# Patient Record
Sex: Female | Born: 1935 | Race: White | Hispanic: No | Marital: Married | Smoking: Never smoker
Health system: Southern US, Community
[De-identification: ages and names within clinical notes are randomized; demographics above are authoritative.]

## PROBLEM LIST (undated history)

## (undated) DIAGNOSIS — N189 Chronic kidney disease, unspecified: Secondary | ICD-10-CM

## (undated) DIAGNOSIS — K5790 Diverticulosis of intestine, part unspecified, without perforation or abscess without bleeding: Secondary | ICD-10-CM

## (undated) DIAGNOSIS — K219 Gastro-esophageal reflux disease without esophagitis: Secondary | ICD-10-CM

## (undated) DIAGNOSIS — M1990 Unspecified osteoarthritis, unspecified site: Secondary | ICD-10-CM

## (undated) DIAGNOSIS — I10 Essential (primary) hypertension: Secondary | ICD-10-CM

## (undated) DIAGNOSIS — J3489 Other specified disorders of nose and nasal sinuses: Secondary | ICD-10-CM

## (undated) DIAGNOSIS — R030 Elevated blood-pressure reading, without diagnosis of hypertension: Secondary | ICD-10-CM

## (undated) HISTORY — PX: OTHER SURGICAL HISTORY: SHX169

## (undated) HISTORY — PX: BILATERAL KNEE ARTHROSCOPY: SUR91

## (undated) HISTORY — DX: Diverticulosis of intestine, part unspecified, without perforation or abscess without bleeding: K57.90

## (undated) HISTORY — PX: ROTATOR CUFF REPAIR: SHX139

## (undated) HISTORY — PX: GALLBLADDER SURGERY: SHX652

## (undated) HISTORY — PX: COLONOSCOPY: SHX174

---

## 1998-09-22 ENCOUNTER — Other Ambulatory Visit: Admission: RE | Admit: 1998-09-22 | Discharge: 1998-09-22 | Payer: Self-pay | Admitting: Urology

## 2000-08-02 ENCOUNTER — Other Ambulatory Visit: Admission: RE | Admit: 2000-08-02 | Discharge: 2000-08-02 | Payer: Self-pay | Admitting: *Deleted

## 2000-09-29 ENCOUNTER — Encounter (INDEPENDENT_AMBULATORY_CARE_PROVIDER_SITE_OTHER): Payer: Self-pay | Admitting: *Deleted

## 2000-09-29 ENCOUNTER — Ambulatory Visit (HOSPITAL_COMMUNITY): Admission: RE | Admit: 2000-09-29 | Discharge: 2000-09-29 | Payer: Self-pay | Admitting: *Deleted

## 2001-02-12 ENCOUNTER — Emergency Department (HOSPITAL_COMMUNITY): Admission: EM | Admit: 2001-02-12 | Discharge: 2001-02-12 | Payer: Self-pay | Admitting: Emergency Medicine

## 2001-02-12 ENCOUNTER — Encounter: Payer: Self-pay | Admitting: Emergency Medicine

## 2002-08-06 ENCOUNTER — Other Ambulatory Visit: Admission: RE | Admit: 2002-08-06 | Discharge: 2002-08-06 | Payer: Self-pay | Admitting: Family Medicine

## 2002-09-09 ENCOUNTER — Encounter: Admission: RE | Admit: 2002-09-09 | Discharge: 2002-09-09 | Payer: Self-pay | Admitting: Family Medicine

## 2002-09-09 ENCOUNTER — Encounter: Payer: Self-pay | Admitting: Family Medicine

## 2004-04-27 ENCOUNTER — Encounter: Admission: RE | Admit: 2004-04-27 | Discharge: 2004-04-27 | Payer: Self-pay | Admitting: Family Medicine

## 2004-06-14 ENCOUNTER — Other Ambulatory Visit: Admission: RE | Admit: 2004-06-14 | Discharge: 2004-06-14 | Payer: Self-pay | Admitting: Family Medicine

## 2004-08-13 ENCOUNTER — Ambulatory Visit (HOSPITAL_COMMUNITY): Admission: RE | Admit: 2004-08-13 | Discharge: 2004-08-13 | Payer: Self-pay | Admitting: *Deleted

## 2004-08-13 ENCOUNTER — Encounter (INDEPENDENT_AMBULATORY_CARE_PROVIDER_SITE_OTHER): Payer: Self-pay | Admitting: *Deleted

## 2008-09-17 ENCOUNTER — Encounter: Admission: RE | Admit: 2008-09-17 | Discharge: 2008-09-17 | Payer: Self-pay | Admitting: Orthopaedic Surgery

## 2008-09-23 ENCOUNTER — Ambulatory Visit (HOSPITAL_BASED_OUTPATIENT_CLINIC_OR_DEPARTMENT_OTHER): Admission: RE | Admit: 2008-09-23 | Discharge: 2008-09-23 | Payer: Self-pay | Admitting: Orthopaedic Surgery

## 2009-08-14 ENCOUNTER — Encounter: Admission: RE | Admit: 2009-08-14 | Discharge: 2009-08-14 | Payer: Self-pay | Admitting: Family Medicine

## 2010-10-26 ENCOUNTER — Other Ambulatory Visit: Payer: Self-pay | Admitting: Family Medicine

## 2010-10-26 DIAGNOSIS — Z1231 Encounter for screening mammogram for malignant neoplasm of breast: Secondary | ICD-10-CM

## 2010-11-03 ENCOUNTER — Ambulatory Visit
Admission: RE | Admit: 2010-11-03 | Discharge: 2010-11-03 | Disposition: A | Discharge status: Home / Self Care | Payer: Medicare Other | Source: Ambulatory Visit | Attending: Family Medicine | Admitting: Family Medicine

## 2010-11-03 DIAGNOSIS — Z1231 Encounter for screening mammogram for malignant neoplasm of breast: Secondary | ICD-10-CM

## 2010-11-16 LAB — POCT HEMOGLOBIN-HEMACUE: Hemoglobin: 14.8 g/dL (ref 12.0–15.0)

## 2010-12-14 NOTE — Op Note (Signed)
[name], [name] NO.:  [id number]   MEDICAL RECORD NO.:  [id number]           PATIENT TYPE:   LOCATION:                                 FACILITY:   PHYSICIAN:  Lubertha Basque. Dalldorf, M.D.DATE OF BIRTH:  September 08, 1935   DATE OF PROCEDURE:  09/23/2008  DATE OF DISCHARGE:                               OPERATIVE REPORT   PREOPERATIVE DIAGNOSES:  1. Left shoulder impingement.  2. Left shoulder acromioclavicular spur.   POSTOPERATIVE DIAGNOSES:  1. Left shoulder impingement  2. Left shoulder acromioclavicular spur.  3. Left shoulder degenerative joint disease.   PROCEDURES:  1. Left shoulder arthroscopic acromioplasty.  2. Left shoulder arthroscopic partial claviculectomy.  3. Left shoulder arthroscopic debridement and chondroplasty.   ANESTHESIA:  General and block.   ATTENDING SURGEON:  Lubertha Basque. Jerl Santos, MD   ASSISTANT:  Lindwood Qua, PA   INDICATIONS FOR PROCEDURE:  The patient is a 75 year old woman with at  least a year of shoulder pain, which we have managed with oral anti-  inflammatories, exercise program, and injection.  She persists with  pain, which limits her ability to rest and use her arm.  She is offered  an arthroscopy.  Informed operative consent was obtained after  discussion of possible complications including reaction to anesthesia  and infection.   SUMMARY/FINDINGS/PROCEDURE:  Under general anesthesia and a block, a  left shoulder arthroscopy was performed.  Glenohumeral joint did show  some grade 3 and 4 change, especially across the humeral head.  A  thorough chondroplasty was done.  The glenoid appeared relatively spared  with just grade 2 and 3 change.  The biceps tendon appeared normal.  The  rotator cuff appeared benign from below and the subscapularis tendon  appeared normal.  In the subacromial space, she had bursitis and a  prominent subacromial morphology.  An acromioplasty was done along with  a coplane of the [facility]  joint.   DESCRIPTION OF PROCEDURE:  The patient was taken to the operating suite  where general anesthetic was applied without difficulty.  She was also  given a block in the preanesthesia area.  She was positioned in a beach  chair position and prepped and draped in normal sterile fashion.  After  demonstration of IV Kefzol, an arthroscopy of left shoulder was  performed through a total of 3 portals.  Findings were as noted above  and procedure consisted of the debridement of the glenohumeral joint  initially.  We did this with the shaver.  She did have some grade 4  change across some dime-sized areas of her humeral head.  Some loose  flaps of articular cartilage were removed.  The biceps tendon appeared  intact though it was partially detached and consistent with a flap on  the superior aspect of the glenoid.  This did not come down into the  joint.  The rotator cuff appeared benign from below.  In the subacromial  space, an acromioplasty was done with a bur in the lateral position  followed by transfer of the bur to the posterior position.  I also  removed a spur  from the distal clavicle.  The cuff was inspected and no  significant tear was found.  The shoulder was thoroughly irrigated  followed by reapproximation of portals loosely with nylon.  Adaptic was  applied followed by dry gauze and tape.  Estimated blood loss and  intraoperative fluids obtained from anesthesia records.   DISPOSITION:  The patient was extubated in the operating room and taken  to recovery room in stable addition.  She was to go home the same day  and follow up with me in my office next week.  I will contact her by  phone tonight.      Lubertha Basque Jerl Santos, M.D.  Electronically Signed     PGD/MEDQ  D:  09/23/2008  T:  09/23/2008  Job:  [id number]

## 2010-12-17 NOTE — Procedures (Signed)
[place]  Patient:    [name], [name]                      MRN: [id number] Proc. Date: 09/29/00 Attending:  Sabino Gasser, M.D.                           Procedure Report  PROCEDURE:  Colonoscopy.  INDICATIONS:  Colon polyps.  ANESTHESIA:  Demerol 70 mg, Versed 7 mg.  DESCRIPTION OF PROCEDURE:  With the patient mildly sedated in the left lateral decubitus position, the Olympus videoscopic colonoscope was inserted in the rectum and passed under direct vision to the cecum, identified by the ileocecal valve and appendiceal orifice.  The cecum was full of corn and solid material, but no gross lesions were seen.  We cleared it as best we could. From this point, the colonoscope was then slowly withdrawn, taking circumferential views of the entire colonic mucosa, stopping only again at about 20 cm from the anal verge, where a small polyp was seen, photographed, and removed using hot biopsy forceps technique, setting of 20-20 blended current.  Once removed, the endoscope was withdrawn through the rectum, which appeared normal on direct but showed internal hemorrhoids on retroflex view. The endoscope was straightened and withdrawn.  The patients vital signs and pulse oximetry remained stable.  The patient tolerated the procedure well without apparent complications.  FINDINGS:  Polyp at 20 cm, otherwise unremarkable colonoscopic examination.  PLAN:  The patient will follow up with me in my office as an outpatient. DD:  09/29/00 TD:  09/29/00 Job: [id number]

## 2011-11-21 ENCOUNTER — Other Ambulatory Visit: Payer: Self-pay | Admitting: Family Medicine

## 2011-11-21 DIAGNOSIS — Z1231 Encounter for screening mammogram for malignant neoplasm of breast: Secondary | ICD-10-CM

## 2011-11-30 ENCOUNTER — Ambulatory Visit
Admission: RE | Admit: 2011-11-30 | Discharge: 2011-11-30 | Disposition: A | Discharge status: Home / Self Care | Payer: Medicare Other | Source: Ambulatory Visit | Attending: Family Medicine | Admitting: Family Medicine

## 2011-11-30 DIAGNOSIS — Z1231 Encounter for screening mammogram for malignant neoplasm of breast: Secondary | ICD-10-CM

## 2015-04-02 ENCOUNTER — Other Ambulatory Visit: Payer: Self-pay | Admitting: Gastroenterology

## 2015-04-21 ENCOUNTER — Other Ambulatory Visit: Payer: Self-pay

## 2015-04-21 DIAGNOSIS — Z1231 Encounter for screening mammogram for malignant neoplasm of breast: Secondary | ICD-10-CM

## 2015-04-24 ENCOUNTER — Ambulatory Visit
Admission: RE | Admit: 2015-04-24 | Discharge: 2015-04-24 | Disposition: A | Discharge status: Home / Self Care | Payer: Commercial Managed Care - HMO | Source: Ambulatory Visit

## 2015-04-24 DIAGNOSIS — Z1231 Encounter for screening mammogram for malignant neoplasm of breast: Secondary | ICD-10-CM

## 2015-04-27 ENCOUNTER — Other Ambulatory Visit: Payer: Self-pay | Admitting: Family Medicine

## 2015-04-27 DIAGNOSIS — R928 Other abnormal and inconclusive findings on diagnostic imaging of breast: Secondary | ICD-10-CM

## 2015-05-01 ENCOUNTER — Other Ambulatory Visit: Payer: Commercial Managed Care - HMO

## 2015-05-08 ENCOUNTER — Other Ambulatory Visit: Payer: Self-pay | Admitting: Family Medicine

## 2015-05-08 ENCOUNTER — Ambulatory Visit
Admission: RE | Admit: 2015-05-08 | Discharge: 2015-05-08 | Disposition: A | Discharge status: Home / Self Care | Payer: Commercial Managed Care - HMO | Source: Ambulatory Visit | Attending: Family Medicine | Admitting: Family Medicine

## 2015-05-08 DIAGNOSIS — R928 Other abnormal and inconclusive findings on diagnostic imaging of breast: Secondary | ICD-10-CM

## 2016-09-22 DIAGNOSIS — M25571 Pain in right ankle and joints of right foot: Secondary | ICD-10-CM | POA: Diagnosis not present

## 2016-09-22 DIAGNOSIS — M76821 Posterior tibial tendinitis, right leg: Secondary | ICD-10-CM | POA: Diagnosis not present

## 2016-10-03 DIAGNOSIS — M76821 Posterior tibial tendinitis, right leg: Secondary | ICD-10-CM | POA: Diagnosis not present

## 2016-10-19 DIAGNOSIS — M76821 Posterior tibial tendinitis, right leg: Secondary | ICD-10-CM | POA: Diagnosis not present

## 2016-11-03 DIAGNOSIS — M76821 Posterior tibial tendinitis, right leg: Secondary | ICD-10-CM | POA: Diagnosis not present

## 2016-11-03 DIAGNOSIS — M79672 Pain in left foot: Secondary | ICD-10-CM | POA: Diagnosis not present

## 2016-11-24 DIAGNOSIS — Z0100 Encounter for examination of eyes and vision without abnormal findings: Secondary | ICD-10-CM | POA: Diagnosis not present

## 2016-11-24 DIAGNOSIS — H524 Presbyopia: Secondary | ICD-10-CM | POA: Diagnosis not present

## 2017-01-11 DIAGNOSIS — M25571 Pain in right ankle and joints of right foot: Secondary | ICD-10-CM | POA: Diagnosis not present

## 2017-01-11 DIAGNOSIS — E559 Vitamin D deficiency, unspecified: Secondary | ICD-10-CM | POA: Diagnosis not present

## 2017-01-11 DIAGNOSIS — E669 Obesity, unspecified: Secondary | ICD-10-CM | POA: Diagnosis not present

## 2017-01-11 DIAGNOSIS — Z6838 Body mass index (BMI) 38.0-38.9, adult: Secondary | ICD-10-CM | POA: Diagnosis not present

## 2017-01-11 DIAGNOSIS — Z9181 History of falling: Secondary | ICD-10-CM | POA: Diagnosis not present

## 2017-01-11 DIAGNOSIS — Z0000 Encounter for general adult medical examination without abnormal findings: Secondary | ICD-10-CM | POA: Diagnosis not present

## 2017-09-15 DIAGNOSIS — Z6839 Body mass index (BMI) 39.0-39.9, adult: Secondary | ICD-10-CM | POA: Diagnosis not present

## 2017-09-15 DIAGNOSIS — G8929 Other chronic pain: Secondary | ICD-10-CM | POA: Diagnosis not present

## 2017-09-15 DIAGNOSIS — H547 Unspecified visual loss: Secondary | ICD-10-CM | POA: Diagnosis not present

## 2017-09-15 DIAGNOSIS — K5900 Constipation, unspecified: Secondary | ICD-10-CM | POA: Diagnosis not present

## 2017-09-15 DIAGNOSIS — M1990 Unspecified osteoarthritis, unspecified site: Secondary | ICD-10-CM | POA: Diagnosis not present

## 2017-09-15 DIAGNOSIS — E559 Vitamin D deficiency, unspecified: Secondary | ICD-10-CM | POA: Diagnosis not present

## 2017-09-15 DIAGNOSIS — Z8249 Family history of ischemic heart disease and other diseases of the circulatory system: Secondary | ICD-10-CM | POA: Diagnosis not present

## 2017-09-15 DIAGNOSIS — R030 Elevated blood-pressure reading, without diagnosis of hypertension: Secondary | ICD-10-CM | POA: Diagnosis not present

## 2017-09-15 DIAGNOSIS — Z809 Family history of malignant neoplasm, unspecified: Secondary | ICD-10-CM | POA: Diagnosis not present

## 2017-10-20 DIAGNOSIS — Z131 Encounter for screening for diabetes mellitus: Secondary | ICD-10-CM | POA: Diagnosis not present

## 2017-10-20 DIAGNOSIS — Z0000 Encounter for general adult medical examination without abnormal findings: Secondary | ICD-10-CM | POA: Diagnosis not present

## 2017-10-20 DIAGNOSIS — M8580 Other specified disorders of bone density and structure, unspecified site: Secondary | ICD-10-CM | POA: Diagnosis not present

## 2017-10-20 DIAGNOSIS — E559 Vitamin D deficiency, unspecified: Secondary | ICD-10-CM | POA: Diagnosis not present

## 2017-10-20 DIAGNOSIS — Z1389 Encounter for screening for other disorder: Secondary | ICD-10-CM | POA: Diagnosis not present

## 2017-10-20 DIAGNOSIS — M150 Primary generalized (osteo)arthritis: Secondary | ICD-10-CM | POA: Diagnosis not present

## 2018-01-26 DIAGNOSIS — M25561 Pain in right knee: Secondary | ICD-10-CM | POA: Diagnosis not present

## 2018-01-26 DIAGNOSIS — M1711 Unilateral primary osteoarthritis, right knee: Secondary | ICD-10-CM | POA: Diagnosis not present

## 2018-02-06 DIAGNOSIS — R69 Illness, unspecified: Secondary | ICD-10-CM | POA: Diagnosis not present

## 2018-02-28 DIAGNOSIS — M8588 Other specified disorders of bone density and structure, other site: Secondary | ICD-10-CM | POA: Diagnosis not present

## 2018-04-06 DIAGNOSIS — H0100B Unspecified blepharitis left eye, upper and lower eyelids: Secondary | ICD-10-CM | POA: Diagnosis not present

## 2018-04-06 DIAGNOSIS — H04123 Dry eye syndrome of bilateral lacrimal glands: Secondary | ICD-10-CM | POA: Diagnosis not present

## 2018-04-06 DIAGNOSIS — H0100A Unspecified blepharitis right eye, upper and lower eyelids: Secondary | ICD-10-CM | POA: Diagnosis not present

## 2018-04-06 DIAGNOSIS — Z9849 Cataract extraction status, unspecified eye: Secondary | ICD-10-CM | POA: Diagnosis not present

## 2018-04-06 DIAGNOSIS — Z961 Presence of intraocular lens: Secondary | ICD-10-CM | POA: Diagnosis not present

## 2018-04-06 DIAGNOSIS — H11153 Pinguecula, bilateral: Secondary | ICD-10-CM | POA: Diagnosis not present

## 2018-04-06 DIAGNOSIS — H11022 Central pterygium of left eye: Secondary | ICD-10-CM | POA: Diagnosis not present

## 2018-04-06 DIAGNOSIS — H18413 Arcus senilis, bilateral: Secondary | ICD-10-CM | POA: Diagnosis not present

## 2018-04-06 DIAGNOSIS — H353111 Nonexudative age-related macular degeneration, right eye, early dry stage: Secondary | ICD-10-CM | POA: Diagnosis not present

## 2018-04-06 DIAGNOSIS — H3530 Unspecified macular degeneration: Secondary | ICD-10-CM | POA: Diagnosis not present

## 2018-05-14 DIAGNOSIS — Z23 Encounter for immunization: Secondary | ICD-10-CM | POA: Diagnosis not present

## 2018-05-14 DIAGNOSIS — Z01810 Encounter for preprocedural cardiovascular examination: Secondary | ICD-10-CM | POA: Diagnosis not present

## 2018-05-14 DIAGNOSIS — H52223 Regular astigmatism, bilateral: Secondary | ICD-10-CM | POA: Diagnosis not present

## 2018-05-14 DIAGNOSIS — M1710 Unilateral primary osteoarthritis, unspecified knee: Secondary | ICD-10-CM | POA: Diagnosis not present

## 2018-05-14 DIAGNOSIS — H5203 Hypermetropia, bilateral: Secondary | ICD-10-CM | POA: Diagnosis not present

## 2018-05-14 DIAGNOSIS — H524 Presbyopia: Secondary | ICD-10-CM | POA: Diagnosis not present

## 2018-05-14 DIAGNOSIS — H353131 Nonexudative age-related macular degeneration, bilateral, early dry stage: Secondary | ICD-10-CM | POA: Diagnosis not present

## 2018-05-26 DIAGNOSIS — H6501 Acute serous otitis media, right ear: Secondary | ICD-10-CM | POA: Diagnosis not present

## 2018-06-18 ENCOUNTER — Ambulatory Visit: Payer: Self-pay | Admitting: Orthopedic Surgery

## 2018-07-31 ENCOUNTER — Ambulatory Visit: Payer: Self-pay | Admitting: Orthopedic Surgery

## 2018-07-31 NOTE — H&P (Signed)
[name] is an 82 y.o. female.   Chief Complaint: R knee pain HPI: [name] is here today for her H&P. She is scheduled for a right total knee arthroplasty on August 15, 2018 at [facility] by Dr. Susa Day.  She reports progressively worsening chronic right knee pain refractory to conservative treatment including injection therapy, activity modification, relative rest, medication, quad strengthening. Pain is interfering with quality-of-life and activities of daily living at this point and she desires to proceed with surgery.  Dr. Tonita Cong and the patient mutually agreed to proceed with a total knee replacement. Risks and benefits of the procedure were discussed including stiffness, suboptimal range of motion, persistent pain, infection requiring removal of prosthesis and reinsertion, need for prophylactic antibiotics in the future, for example, dental procedures, possible need for manipulation, revision in the future and also anesthetic complications including DVT, PE, etc. We discussed the perioperative course, time in the hospital, postoperative recovery and the need for elevation to control swelling. We also discussed the predicted range of motion and the probability that squatting and kneeling would be unobtainable in the future. In addition, postoperative anticoagulation was discussed. We have obtained preoperative medical clearance as necessary. Provided illustrated handout and discussed it in detail. They will enroll in the total joint replacement educational forum at the hospital.  She has not yet scheduled for her [name] long preop appointment. She has been cleared by her PCP Dr. Laurann Montana. She has no history of MRSA or DVT. She tolerates aspirin without an issue. She has not had issues with postop nausea and vomiting in the past. She has not had allergies to pain medications.  Past Medical Hx: Osteoarthritis Measles Mumps  Past Surgical Hx: R knee arthroscopy  Rotator  cuff repair Cholecystectomy  No family history on file.   Social History:  Tobacco Smoking Status: Never smoker Non-smoker Most Recent Tobacco Use Screening: 01/26/2018 Chewing tobacco: none Alcohol intake: None Hand Dominance: Right Work related injury?: N Advance directive: N Medical Power of Attorney: N  Allergies: NKDA  Medications: ergocalciferol (vitamin D2) 1,250 mcg (50,000 unit) capsule Multi Vitamin  Review of Systems  Constitutional: Negative.   HENT: Negative.   Eyes: Negative.   Respiratory: Negative.   Cardiovascular: Negative.   Gastrointestinal: Negative.   Genitourinary: Negative.   Musculoskeletal: Positive for joint pain.  Skin: Negative.   Neurological: Negative.   Psychiatric/Behavioral: Negative.     There were no vitals taken for this visit. Physical Exam  Constitutional: She is oriented to person, place, and time. She appears well-developed and well-nourished.  HENT:  Head: Normocephalic.  Eyes: Pupils are equal, round, and reactive to light. Conjunctivae are normal.  Neck: Normal range of motion.  Cardiovascular: Normal rate.  Respiratory: Effort normal.  GI: Soft.  Musculoskeletal:     Comments: Patient is an 82 year old female.  Constitutional: General Appearance: healthy-appearing and NAD.  Gait and Station: Appearance: antalgic gait.  Cardiovascular System: Arterial Pulses Right: femoral normal, popliteal normal, dorsalis pedis normal, and posterior tibialis normal. Edema Right: no edema. Varicosities Right: no varicosities. Varicosities Left: no varicosities and capillary refill test normal.  Lymph Nodes: Inspection/Palpation Right: no inguinal LAD.  Knees: Inspection Right: no deformity. Inspection Left: swelling and genu varum deformity. Bony Palpation Right: no tenderness of the inferior pole patella, the superior pole patella, the tibial tubercle, the lateral joint line, the medial tibial plateau, Gerdy's tubercle, or the neck  of fibula and tenderness of the lateral patellar facet, the medial femoral  condyle, the medial joint line, and the lateral femoral condyle. Soft Tissue Palpation Right: no tenderness of the quadriceps tendon, the prepatellar bursa, the patellar tendon, the medial collateral ligament, the saphenous nerve, the lateral collateral ligament, the infrapatellar tendon, or the common peroneal nerve. Active Range of Motion Right: flexion (110 deg) and extension (0 deg.). Stability Right: no ligamentous instability, anterior drawer sign negative, posterior drawer sign negative, Lachman test negative, and laxity. Special Tests Right: McMurray's test negative. Strength Right: no hamstring weakness or quadriceps weakness and flexion 5/5 and extension 5/5.  Skin: Right Lower Extremity: normal.  Neurologic: Ankle Reflex Right: normal (2). Knee Reflex Right: normal (2). Sensation on the Right: L2 normal, L3 normal, L4 normal, L5 normal, and S1 normal.  Psychiatric: Mood and Affect: active and alert and normal mood.   Neurological: She is alert and oriented to person, place, and time.  Skin: Skin is warm and dry.    Standing x-rays with bone-on-bone medial joint space narrowing with a varus deformity.  Assessment/Plan 1. Osteoarthritis of right knee joint M17.11: Unilateral primary osteoarthritis, right knee  Plan: Pt with end-stage Right knee DJD, bone-on-bone, refractory to conservative tx, scheduled for Right total knee replacement by Dr. Tonita Cong on 08/15/2018. We again discussed the procedure itself as well as risks, complications and alternatives, including but not limited to DVT, PE, infx, bleeding, failure of procedure, need for secondary procedure including manipulation, nerve injury, ongoing pain/symptoms, anesthesia risk, even stroke or death. Also discussed typical post-op protocols, activity restrictions, need for PT, flexion/extension exercises, time out of work. Discussed need for DVT ppx post-op per  protocol. Discussed dental ppx and infx prevention. Also discussed limitations post-operatively such as kneeling and squatting. All questions were answered. Patient desires to proceed with surgery as scheduled.  Will hold supplements, ASA and NSAIDs accordingly. Will remain NPO after midnight the night before surgery. Will present to [facility] for pre-op testing. Anticipate hospital stay to include at least 2 midnights given medical history and to ensure proper pain control. Plan ASA 325mg  BID for DVT ppx post-op. Plan Oxycodone, Colace, Miralax. Plan home with HHPT post-op with family members at home for assistance then transition to outpt PT at 2 weeks post-op. Will follow up 10-14 days post-op for staple removal and xrays.  Anticipated LOS equal to or greater than 2 midnights due to - Age 67 and older with one or more of the following:  - Obesity  - Expected need for hospital services (PT, OT, Nursing) required for safe  discharge  - Anticipated need for postoperative skilled nursing care or inpatient rehab  - Active co-morbidities: None  Plan Right total knee replacement  Cecilie Kicks., PA-C for Dr. Tonita Cong 07/31/2018, 10:11 AM

## 2018-07-31 NOTE — H&P (View-Only) (Signed)
[name] is an 82 y.o. female.   Chief Complaint: R knee pain HPI: [name] is here today for her H&P. She is scheduled for a right total knee arthroplasty on August 15, 2018 at [facility] by Dr. Susa Day.  She reports progressively worsening chronic right knee pain refractory to conservative treatment including injection therapy, activity modification, relative rest, medication, quad strengthening. Pain is interfering with quality-of-life and activities of daily living at this point and she desires to proceed with surgery.  Dr. Tonita Cong and the patient mutually agreed to proceed with a total knee replacement. Risks and benefits of the procedure were discussed including stiffness, suboptimal range of motion, persistent pain, infection requiring removal of prosthesis and reinsertion, need for prophylactic antibiotics in the future, for example, dental procedures, possible need for manipulation, revision in the future and also anesthetic complications including DVT, PE, etc. We discussed the perioperative course, time in the hospital, postoperative recovery and the need for elevation to control swelling. We also discussed the predicted range of motion and the probability that squatting and kneeling would be unobtainable in the future. In addition, postoperative anticoagulation was discussed. We have obtained preoperative medical clearance as necessary. Provided illustrated handout and discussed it in detail. They will enroll in the total joint replacement educational forum at the hospital.  She has not yet scheduled for her [name] long preop appointment. She has been cleared by her PCP Dr. Laurann Montana. She has no history of MRSA or DVT. She tolerates aspirin without an issue. She has not had issues with postop nausea and vomiting in the past. She has not had allergies to pain medications.  Past Medical Hx: Osteoarthritis Measles Mumps  Past Surgical Hx: R knee arthroscopy  Rotator  cuff repair Cholecystectomy  No family history on file.   Social History:  Tobacco Smoking Status: Never smoker Non-smoker Most Recent Tobacco Use Screening: 01/26/2018 Chewing tobacco: none Alcohol intake: None Hand Dominance: Right Work related injury?: N Advance directive: N Medical Power of Attorney: N  Allergies: NKDA  Medications: ergocalciferol (vitamin D2) 1,250 mcg (50,000 unit) capsule Multi Vitamin  Review of Systems  Constitutional: Negative.   HENT: Negative.   Eyes: Negative.   Respiratory: Negative.   Cardiovascular: Negative.   Gastrointestinal: Negative.   Genitourinary: Negative.   Musculoskeletal: Positive for joint pain.  Skin: Negative.   Neurological: Negative.   Psychiatric/Behavioral: Negative.     There were no vitals taken for this visit. Physical Exam  Constitutional: She is oriented to person, place, and time. She appears well-developed and well-nourished.  HENT:  Head: Normocephalic.  Eyes: Pupils are equal, round, and reactive to light. Conjunctivae are normal.  Neck: Normal range of motion.  Cardiovascular: Normal rate.  Respiratory: Effort normal.  GI: Soft.  Musculoskeletal:     Comments: Patient is an 82 year old female.  Constitutional: General Appearance: healthy-appearing and NAD.  Gait and Station: Appearance: antalgic gait.  Cardiovascular System: Arterial Pulses Right: femoral normal, popliteal normal, dorsalis pedis normal, and posterior tibialis normal. Edema Right: no edema. Varicosities Right: no varicosities. Varicosities Left: no varicosities and capillary refill test normal.  Lymph Nodes: Inspection/Palpation Right: no inguinal LAD.  Knees: Inspection Right: no deformity. Inspection Left: swelling and genu varum deformity. Bony Palpation Right: no tenderness of the inferior pole patella, the superior pole patella, the tibial tubercle, the lateral joint line, the medial tibial plateau, Gerdy's tubercle, or the neck  of fibula and tenderness of the lateral patellar facet, the medial femoral  condyle, the medial joint line, and the lateral femoral condyle. Soft Tissue Palpation Right: no tenderness of the quadriceps tendon, the prepatellar bursa, the patellar tendon, the medial collateral ligament, the saphenous nerve, the lateral collateral ligament, the infrapatellar tendon, or the common peroneal nerve. Active Range of Motion Right: flexion (110 deg) and extension (0 deg.). Stability Right: no ligamentous instability, anterior drawer sign negative, posterior drawer sign negative, Lachman test negative, and laxity. Special Tests Right: McMurray's test negative. Strength Right: no hamstring weakness or quadriceps weakness and flexion 5/5 and extension 5/5.  Skin: Right Lower Extremity: normal.  Neurologic: Ankle Reflex Right: normal (2). Knee Reflex Right: normal (2). Sensation on the Right: L2 normal, L3 normal, L4 normal, L5 normal, and S1 normal.  Psychiatric: Mood and Affect: active and alert and normal mood.   Neurological: She is alert and oriented to person, place, and time.  Skin: Skin is warm and dry.    Standing x-rays with bone-on-bone medial joint space narrowing with a varus deformity.  Assessment/Plan 1. Osteoarthritis of right knee joint M17.11: Unilateral primary osteoarthritis, right knee  Plan: Pt with end-stage Right knee DJD, bone-on-bone, refractory to conservative tx, scheduled for Right total knee replacement by Dr. Tonita Cong on 08/15/2018. We again discussed the procedure itself as well as risks, complications and alternatives, including but not limited to DVT, PE, infx, bleeding, failure of procedure, need for secondary procedure including manipulation, nerve injury, ongoing pain/symptoms, anesthesia risk, even stroke or death. Also discussed typical post-op protocols, activity restrictions, need for PT, flexion/extension exercises, time out of work. Discussed need for DVT ppx post-op per  protocol. Discussed dental ppx and infx prevention. Also discussed limitations post-operatively such as kneeling and squatting. All questions were answered. Patient desires to proceed with surgery as scheduled.  Will hold supplements, ASA and NSAIDs accordingly. Will remain NPO after midnight the night before surgery. Will present to [facility] for pre-op testing. Anticipate hospital stay to include at least 2 midnights given medical history and to ensure proper pain control. Plan ASA 325mg  BID for DVT ppx post-op. Plan Oxycodone, Colace, Miralax. Plan home with HHPT post-op with family members at home for assistance then transition to outpt PT at 2 weeks post-op. Will follow up 10-14 days post-op for staple removal and xrays.  Anticipated LOS equal to or greater than 2 midnights due to - Age 65 and older with one or more of the following:  - Obesity  - Expected need for hospital services (PT, OT, Nursing) required for safe  discharge  - Anticipated need for postoperative skilled nursing care or inpatient rehab  - Active co-morbidities: None  Plan Right total knee replacement  Cecilie Kicks., PA-C for Dr. Tonita Cong 07/31/2018, 10:11 AM

## 2018-08-09 NOTE — Patient Instructions (Signed)
[name]  08/09/2018   Your procedure is scheduled on: 08-15-2018    Report to [facility] Main  Entrance     Report to admitting at 6:30AM    Call this number if you have problems the morning of surgery [phone]       Remember: Do not eat food or drink liquids :After Midnight. BRUSH YOUR TEETH MORNING OF SURGERY AND RINSE YOUR MOUTH OUT, NO CHEWING GUM CANDY OR MINTS.     Take these medicines the morning of surgery with A SIP OF WATER: EYE DROPS IF NEEDED                                 You may not have any metal on your body including hair pins and              piercings  Do not wear jewelry, make-up, lotions, powders or perfumes, deodorant             Do not wear nail polish.  Do not shave  48 hours prior to surgery.               Do not bring valuables to the hospital. [place].  Contacts, dentures or bridgework may not be worn into surgery.  Leave suitcase in the car. After surgery it may be brought to your room.                   Please read over the following fact sheets you were given: _____________________________________________________________________             [facility] - Preparing for Surgery Before surgery, you can play an important role.  Because skin is not sterile, your skin needs to be as free of germs as possible.  You can reduce the number of germs on your skin by washing with CHG (chlorahexidine gluconate) soap before surgery.  CHG is an antiseptic cleaner which kills germs and bonds with the skin to continue killing germs even after washing. Please DO NOT use if you have an allergy to CHG or antibacterial soaps.  If your skin becomes reddened/irritated stop using the CHG and inform your nurse when you arrive at Short Stay. Do not shave (including legs and underarms) for at least 48 hours prior to the first CHG shower.  You may shave your face/neck. Please follow  these instructions carefully:  1.  Shower with CHG Soap the night before surgery and the  morning of Surgery.  2.  If you choose to wash your hair, wash your hair first as usual with your  normal  shampoo.  3.  After you shampoo, rinse your hair and body thoroughly to remove the  shampoo.                           4.  Use CHG as you would any other liquid soap.  You can apply chg directly  to the skin and wash                       Gently with a scrungie or clean washcloth.  5.  Apply the CHG Soap to your  body ONLY FROM THE NECK DOWN.   Do not use on face/ open                           Wound or open sores. Avoid contact with eyes, ears mouth and genitals (private parts).                       Wash face,  Genitals (private parts) with your normal soap.             6.  Wash thoroughly, paying special attention to the area where your surgery  will be performed.  7.  Thoroughly rinse your body with warm water from the neck down.  8.  DO NOT shower/wash with your normal soap after using and rinsing off  the CHG Soap.                9.  Pat yourself dry with a clean towel.            10.  Wear clean pajamas.            11.  Place clean sheets on your bed the night of your first shower and do not  sleep with pets. Day of Surgery : Do not apply any lotions/deodorants the morning of surgery.  Please wear clean clothes to the hospital/surgery center.  FAILURE TO FOLLOW THESE INSTRUCTIONS MAY RESULT IN THE CANCELLATION OF YOUR SURGERY PATIENT SIGNATURE_________________________________  NURSE SIGNATURE__________________________________  ________________________________________________________________________   Adam Phenix  An incentive spirometer is a tool that can help keep your lungs clear and active. This tool measures how well you are filling your lungs with each breath. Taking long deep breaths may help reverse or decrease the chance of developing breathing (pulmonary) problems  (especially infection) following:  A long period of time when you are unable to move or be active. BEFORE THE PROCEDURE   If the spirometer includes an indicator to show your best effort, your nurse or respiratory therapist will set it to a desired goal.  If possible, sit up straight or lean slightly forward. Try not to slouch.  Hold the incentive spirometer in an upright position. INSTRUCTIONS FOR USE  1. Sit on the edge of your bed if possible, or sit up as far as you can in bed or on a chair. 2. Hold the incentive spirometer in an upright position. 3. Breathe out normally. 4. Place the mouthpiece in your mouth and seal your lips tightly around it. 5. Breathe in slowly and as deeply as possible, raising the piston or the ball toward the top of the column. 6. Hold your breath for 3-5 seconds or for as long as possible. Allow the piston or ball to fall to the bottom of the column. 7. Remove the mouthpiece from your mouth and breathe out normally. 8. Rest for a few seconds and repeat Steps 1 through 7 at least 10 times every 1-2 hours when you are awake. Take your time and take a few normal breaths between deep breaths. 9. The spirometer may include an indicator to show your best effort. Use the indicator as a goal to work toward during each repetition. 10. After each set of 10 deep breaths, practice coughing to be sure your lungs are clear. If you have an incision (the cut made at the time of surgery), support your incision when coughing by placing a pillow or rolled up towels firmly against it.  Once you are able to get out of bed, walk around indoors and cough well. You may stop using the incentive spirometer when instructed by your caregiver.  RISKS AND COMPLICATIONS  Take your time so you do not get dizzy or light-headed.  If you are in pain, you may need to take or ask for pain medication before doing incentive spirometry. It is harder to take a deep breath if you are having  pain. AFTER USE  Rest and breathe slowly and easily.  It can be helpful to keep track of a log of your progress. Your caregiver can provide you with a simple table to help with this. If you are using the spirometer at home, follow these instructions: [place] IF:   You are having difficultly using the spirometer.  You have trouble using the spirometer as often as instructed.  Your pain medication is not giving enough relief while using the spirometer.  You develop fever of 100.5 F (38.1 C) or higher. SEEK IMMEDIATE MEDICAL CARE IF:   You cough up bloody sputum that had not been present before.  You develop fever of 102 F (38.9 C) or greater.  You develop worsening pain at or near the incision site. MAKE SURE YOU:   Understand these instructions.  Will watch your condition.  Will get help right away if you are not doing well or get worse. Document Released: 11/28/2006 Document Revised: 10/10/2011 Document Reviewed: 01/29/2007 [place] Patient Information 2014 [place], [place].   ________________________________________________________________________

## 2018-08-09 NOTE — Progress Notes (Signed)
SURGICAL CLEARANCE ON CHART

## 2018-08-10 ENCOUNTER — Encounter (HOSPITAL_COMMUNITY)
Admission: RE | Admit: 2018-08-10 | Discharge: 2018-08-10 | Disposition: A | Discharge status: Home / Self Care | Payer: Medicare Other | Source: Ambulatory Visit | Attending: Specialist | Admitting: Specialist

## 2018-08-10 ENCOUNTER — Other Ambulatory Visit: Payer: Self-pay

## 2018-08-10 ENCOUNTER — Encounter (INDEPENDENT_AMBULATORY_CARE_PROVIDER_SITE_OTHER): Payer: Self-pay

## 2018-08-10 ENCOUNTER — Encounter (HOSPITAL_COMMUNITY): Payer: Self-pay

## 2018-08-10 DIAGNOSIS — Z01812 Encounter for preprocedural laboratory examination: Secondary | ICD-10-CM | POA: Diagnosis present

## 2018-08-10 DIAGNOSIS — M1711 Unilateral primary osteoarthritis, right knee: Secondary | ICD-10-CM | POA: Insufficient documentation

## 2018-08-10 DIAGNOSIS — R030 Elevated blood-pressure reading, without diagnosis of hypertension: Secondary | ICD-10-CM

## 2018-08-10 HISTORY — DX: Elevated blood-pressure reading, without diagnosis of hypertension: R03.0

## 2018-08-10 HISTORY — DX: Other specified disorders of nose and nasal sinuses: J34.89

## 2018-08-10 HISTORY — DX: Unspecified osteoarthritis, unspecified site: M19.90

## 2018-08-10 LAB — URINALYSIS, ROUTINE W REFLEX MICROSCOPIC
Bilirubin Urine: NEGATIVE
Glucose, UA: NEGATIVE mg/dL
Ketones, ur: NEGATIVE mg/dL
Leukocytes, UA: NEGATIVE
Nitrite: NEGATIVE
Protein, ur: NEGATIVE mg/dL
Specific Gravity, Urine: 1.011 (ref 1.005–1.030)
pH: 5 (ref 5.0–8.0)

## 2018-08-10 LAB — CBC
HCT: 45.1 % (ref 36.0–46.0)
Hemoglobin: 14.2 g/dL (ref 12.0–15.0)
MCH: 29 pg (ref 26.0–34.0)
MCHC: 31.5 g/dL (ref 30.0–36.0)
MCV: 92.2 fL (ref 80.0–100.0)
Platelets: 359 K/uL (ref 150–400)
RBC: 4.89 MIL/uL (ref 3.87–5.11)
RDW: 14.5 % (ref 11.5–15.5)
WBC: 7 10*3/uL (ref 4.0–10.5)
nRBC: 0 % (ref 0.0–0.2)

## 2018-08-10 LAB — BASIC METABOLIC PANEL
Anion gap: 7 (ref 5–15)
BUN: 21 mg/dL (ref 8–23)
CO2: 29 mmol/L (ref 22–32)
Calcium: 9.3 mg/dL (ref 8.9–10.3)
Chloride: 105 mmol/L (ref 98–111)
Creatinine, Ser: 0.79 mg/dL (ref 0.44–1.00)
GFR calc Af Amer: >60 mL/min (ref >60)
GFR calc non Af Amer: >60 mL/min (ref >60)
Glucose, Bld: 107 mg/dL — ABNORMAL HIGH (ref 70–99)
Potassium: 4.2 mmol/L (ref 3.5–5.1)
Sodium: 141 mmol/L (ref 135–145)

## 2018-08-10 LAB — SURGICAL PCR SCREEN
MRSA, PCR: NEGATIVE
Staphylococcus aureus: NEGATIVE

## 2018-08-10 LAB — PROTIME-INR
INR: 0.97
Prothrombin Time: 12.7 seconds (ref 11.4–15.2)

## 2018-08-10 LAB — APTT: aPTT: 32 seconds (ref 24–36)

## 2018-08-14 NOTE — Anesthesia Preprocedure Evaluation (Addendum)
Anesthesia Evaluation  Patient identified by MRN, date of birth, ID band Patient awake    Reviewed: Allergy & Precautions, NPO status , Patient's Chart, lab work & pertinent test results  Airway Mallampati: II  TM Distance: >3 FB Neck ROM: Full    Dental  (+) Dental Advisory Given   Pulmonary neg pulmonary ROS,    breath sounds clear to auscultation       Cardiovascular negative cardio ROS   Rhythm:Regular Rate:Normal     Neuro/Psych negative neurological ROS     GI/Hepatic negative GI ROS, Neg liver ROS,   Endo/Other  negative endocrine ROS  Renal/GU negative Renal ROS     Musculoskeletal   Abdominal   Peds  Hematology negative hematology ROS (+)   Anesthesia Other Findings   Reproductive/Obstetrics                            Lab Results  Component Value Date   WBC 7.0 08/10/2018   HGB 14.2 08/10/2018   HCT 45.1 08/10/2018   MCV 92.2 08/10/2018   PLT 359 08/10/2018   Lab Results  Component Value Date   INR 0.97 08/10/2018   Lab Results  Component Value Date   CREATININE 0.79 08/10/2018   BUN 21 08/10/2018   NA 141 08/10/2018   K 4.2 08/10/2018   CL 105 08/10/2018   CO2 29 08/10/2018    Anesthesia Physical Anesthesia Plan  ASA: II  Anesthesia Plan: Spinal   Post-op Pain Management:  Regional for Post-op pain   Induction: Intravenous  PONV Risk Score and Plan: 2 and Dexamethasone, Ondansetron and Treatment may vary due to age or medical condition  Airway Management Planned: Simple Face Mask and Natural Airway  Additional Equipment:   Intra-op Plan:   Post-operative Plan:   Informed Consent: I have reviewed the patients History and Physical, chart, labs and discussed the procedure including the risks, benefits and alternatives for the proposed anesthesia with the patient or authorized representative who has indicated his/her understanding and acceptance.        Plan Discussed with: CRNA  Anesthesia Plan Comments:        Anesthesia Quick Evaluation

## 2018-08-15 ENCOUNTER — Other Ambulatory Visit: Payer: Self-pay

## 2018-08-15 ENCOUNTER — Encounter (HOSPITAL_COMMUNITY)
Admission: AD | Disposition: A | Discharge status: Home / Self Care | Payer: Self-pay | Source: Home / Self Care | Attending: Specialist

## 2018-08-15 ENCOUNTER — Observation Stay (HOSPITAL_COMMUNITY)
Admission: AD | Admit: 2018-08-15 | Discharge: 2018-08-18 | Disposition: A | Discharge status: Home / Self Care | Payer: Medicare Other | Attending: Specialist | Admitting: Specialist

## 2018-08-15 ENCOUNTER — Inpatient Hospital Stay (HOSPITAL_COMMUNITY): Payer: Medicare Other

## 2018-08-15 ENCOUNTER — Encounter (HOSPITAL_COMMUNITY): Payer: Self-pay | Admitting: *Deleted

## 2018-08-15 ENCOUNTER — Ambulatory Visit (HOSPITAL_COMMUNITY): Payer: Medicare Other | Admitting: Anesthesiology

## 2018-08-15 ENCOUNTER — Ambulatory Visit (HOSPITAL_COMMUNITY): Payer: Medicare Other | Admitting: Physician Assistant

## 2018-08-15 DIAGNOSIS — M1711 Unilateral primary osteoarthritis, right knee: Secondary | ICD-10-CM | POA: Diagnosis not present

## 2018-08-15 DIAGNOSIS — M25561 Pain in right knee: Secondary | ICD-10-CM | POA: Diagnosis present

## 2018-08-15 DIAGNOSIS — Z96659 Presence of unspecified artificial knee joint: Secondary | ICD-10-CM

## 2018-08-15 HISTORY — PX: TOTAL KNEE ARTHROPLASTY: SHX125

## 2018-08-15 SURGERY — ARTHROPLASTY, KNEE, TOTAL
Anesthesia: Spinal | Site: Knee | Laterality: Right

## 2018-08-15 MED ORDER — DOCUSATE SODIUM 100 MG PO CAPS
100.0000 mg | ORAL_CAPSULE | Freq: Two times a day (BID) | ORAL | Status: DC
  Administered 2018-08-15 – 2018-08-18 (×6): 100 mg via ORAL
  Filled 2018-08-15 (×6): qty 1

## 2018-08-15 MED ORDER — KCL IN DEXTROSE-NACL 20-5-0.45 MEQ/L-%-% IV SOLN
INTRAVENOUS | Status: AC
  Administered 2018-08-15 – 2018-08-16 (×2): via INTRAVENOUS
  Filled 2018-08-15 (×2): qty 1000

## 2018-08-15 MED ORDER — ONDANSETRON HCL 4 MG/2ML IJ SOLN
INTRAMUSCULAR | Status: AC
  Filled 2018-08-15: qty 2

## 2018-08-15 MED ORDER — OXYCODONE HCL 5 MG PO TABS
5.0000 mg | ORAL_TABLET | ORAL | Status: DC | PRN
  Administered 2018-08-15 (×2): 5 mg via ORAL
  Administered 2018-08-17 (×2): 10 mg via ORAL
  Administered 2018-08-18: 5 mg via ORAL
  Filled 2018-08-15 (×2): qty 1
  Filled 2018-08-15: qty 2
  Filled 2018-08-15: qty 1
  Filled 2018-08-15: qty 2

## 2018-08-15 MED ORDER — FENTANYL CITRATE (PF) 100 MCG/2ML IJ SOLN
INTRAMUSCULAR | Status: AC
  Filled 2018-08-15: qty 2

## 2018-08-15 MED ORDER — CEFAZOLIN SODIUM-DEXTROSE 2-4 GM/100ML-% IV SOLN
INTRAVENOUS | Status: AC
  Filled 2018-08-15: qty 100

## 2018-08-15 MED ORDER — SODIUM CHLORIDE 0.9 % IV SOLN
INTRAVENOUS | Status: DC | PRN
  Administered 2018-08-15: 500 mL

## 2018-08-15 MED ORDER — PROPOFOL 500 MG/50ML IV EMUL
INTRAVENOUS | Status: DC | PRN
  Administered 2018-08-15: 50 ug/kg/min via INTRAVENOUS

## 2018-08-15 MED ORDER — MIDAZOLAM HCL 2 MG/2ML IJ SOLN
INTRAMUSCULAR | Status: AC
  Filled 2018-08-15: qty 2

## 2018-08-15 MED ORDER — ONDANSETRON HCL 4 MG/2ML IJ SOLN: 4.0000 mg | Freq: Once | INTRAMUSCULAR | Status: DC | PRN

## 2018-08-15 MED ORDER — TRANEXAMIC ACID-NACL 1000-0.7 MG/100ML-% IV SOLN
INTRAVENOUS | Status: AC
  Filled 2018-08-15: qty 100

## 2018-08-15 MED ORDER — METHOCARBAMOL 500 MG PO TABS
500.0000 mg | ORAL_TABLET | Freq: Four times a day (QID) | ORAL | Status: DC | PRN
  Administered 2018-08-15 – 2018-08-17 (×4): 500 mg via ORAL
  Filled 2018-08-15 (×4): qty 1

## 2018-08-15 MED ORDER — BUPIVACAINE-EPINEPHRINE (PF) 0.25% -1:200000 IJ SOLN
INTRAMUSCULAR | Status: AC
  Filled 2018-08-15: qty 30

## 2018-08-15 MED ORDER — OXYCODONE HCL 5 MG PO TABS
10.0000 mg | ORAL_TABLET | ORAL | Status: DC | PRN
  Administered 2018-08-15 – 2018-08-16 (×6): 15 mg via ORAL
  Administered 2018-08-17: 10 mg via ORAL
  Administered 2018-08-17: 15 mg via ORAL
  Filled 2018-08-15 (×8): qty 3

## 2018-08-15 MED ORDER — METHOCARBAMOL 500 MG IVPB - SIMPLE MED
500.0000 mg | Freq: Four times a day (QID) | INTRAVENOUS | Status: DC | PRN
  Filled 2018-08-15: qty 50

## 2018-08-15 MED ORDER — ACETAMINOPHEN 10 MG/ML IV SOLN
1000.0000 mg | INTRAVENOUS | Status: AC
  Administered 2018-08-15: 1000 mg via INTRAVENOUS
  Filled 2018-08-15: qty 100

## 2018-08-15 MED ORDER — ACETAMINOPHEN 500 MG PO TABS
1000.0000 mg | ORAL_TABLET | Freq: Four times a day (QID) | ORAL | Status: AC
  Administered 2018-08-15 – 2018-08-16 (×4): 1000 mg via ORAL
  Filled 2018-08-15 (×4): qty 2

## 2018-08-15 MED ORDER — BUPIVACAINE-EPINEPHRINE 0.25% -1:200000 IJ SOLN
INTRAMUSCULAR | Status: DC | PRN
  Administered 2018-08-15: 30 mL

## 2018-08-15 MED ORDER — POLYETHYLENE GLYCOL 3350 17 G PO PACK
17.0000 g | PACK | Freq: Every day | ORAL | Status: DC
  Administered 2018-08-16 – 2018-08-18 (×2): 17 g via ORAL
  Filled 2018-08-15 (×3): qty 1

## 2018-08-15 MED ORDER — PHENOL 1.4 % MT LIQD: 1.0000 | OROMUCOSAL | Status: DC | PRN

## 2018-08-15 MED ORDER — MENTHOL 3 MG MT LOZG: 1.0000 | LOZENGE | OROMUCOSAL | Status: DC | PRN

## 2018-08-15 MED ORDER — HYPROMELLOSE (GONIOSCOPIC) 2.5 % OP SOLN: 1.0000 [drp] | Freq: Every day | OPHTHALMIC | Status: DC | PRN

## 2018-08-15 MED ORDER — FENTANYL CITRATE (PF) 100 MCG/2ML IJ SOLN
INTRAMUSCULAR | Status: DC | PRN
  Administered 2018-08-15 (×2): 50 ug via INTRAVENOUS

## 2018-08-15 MED ORDER — RISAQUAD PO CAPS
1.0000 | ORAL_CAPSULE | Freq: Every day | ORAL | Status: DC
  Administered 2018-08-15 – 2018-08-18 (×4): 1 via ORAL
  Filled 2018-08-15 (×4): qty 1

## 2018-08-15 MED ORDER — SODIUM CHLORIDE 0.9 % IV SOLN
INTRAVENOUS | Status: AC
  Filled 2018-08-15: qty 500000

## 2018-08-15 MED ORDER — SODIUM CHLORIDE 0.9 % IR SOLN
Status: DC | PRN
  Administered 2018-08-15: 1000 mL

## 2018-08-15 MED ORDER — ALUM & MAG HYDROXIDE-SIMETH 200-200-20 MG/5ML PO SUSP: 30.0000 mL | ORAL | Status: DC | PRN

## 2018-08-15 MED ORDER — MAGNESIUM CITRATE PO SOLN: 1.0000 | Freq: Once | ORAL | Status: DC | PRN

## 2018-08-15 MED ORDER — LIDOCAINE HCL (CARDIAC) PF 100 MG/5ML IV SOSY
PREFILLED_SYRINGE | INTRAVENOUS | Status: DC | PRN
  Administered 2018-08-15: 30 mg via INTRAVENOUS

## 2018-08-15 MED ORDER — ONDANSETRON HCL 4 MG/2ML IJ SOLN
4.0000 mg | Freq: Four times a day (QID) | INTRAMUSCULAR | Status: DC | PRN
  Administered 2018-08-15 – 2018-08-16 (×2): 4 mg via INTRAVENOUS
  Filled 2018-08-15 (×2): qty 2

## 2018-08-15 MED ORDER — LACTATED RINGERS IV SOLN
INTRAVENOUS | Status: DC
  Administered 2018-08-15: 07:00:00 via INTRAVENOUS

## 2018-08-15 MED ORDER — PROMETHAZINE HCL 25 MG PO TABS: 12.5000 mg | ORAL_TABLET | Freq: Four times a day (QID) | ORAL | Status: DC | PRN

## 2018-08-15 MED ORDER — MIDAZOLAM HCL 5 MG/5ML IJ SOLN
INTRAMUSCULAR | Status: DC | PRN
  Administered 2018-08-15 (×2): 1 mg via INTRAVENOUS

## 2018-08-15 MED ORDER — PROMETHAZINE HCL 25 MG/ML IJ SOLN: 12.5000 mg | Freq: Four times a day (QID) | INTRAMUSCULAR | Status: DC | PRN

## 2018-08-15 MED ORDER — ONDANSETRON HCL 4 MG/2ML IJ SOLN
INTRAMUSCULAR | Status: DC | PRN
  Administered 2018-08-15: 4 mg via INTRAVENOUS

## 2018-08-15 MED ORDER — DEXAMETHASONE SODIUM PHOSPHATE 10 MG/ML IJ SOLN
INTRAMUSCULAR | Status: AC
  Filled 2018-08-15: qty 1

## 2018-08-15 MED ORDER — METOCLOPRAMIDE HCL 5 MG/ML IJ SOLN
5.0000 mg | Freq: Three times a day (TID) | INTRAMUSCULAR | Status: DC | PRN
  Administered 2018-08-15 – 2018-08-17 (×3): 10 mg via INTRAVENOUS
  Filled 2018-08-15 (×3): qty 2

## 2018-08-15 MED ORDER — ONDANSETRON HCL 4 MG PO TABS
4.0000 mg | ORAL_TABLET | Freq: Four times a day (QID) | ORAL | Status: DC | PRN
  Filled 2018-08-15: qty 1

## 2018-08-15 MED ORDER — BUPIVACAINE IN DEXTROSE 0.75-8.25 % IT SOLN
INTRATHECAL | Status: DC | PRN
  Administered 2018-08-15: 15 mg via INTRATHECAL

## 2018-08-15 MED ORDER — LACTATED RINGERS IV SOLN
INTRAVENOUS | Status: DC
  Administered 2018-08-15 (×2): via INTRAVENOUS

## 2018-08-15 MED ORDER — DEXAMETHASONE SODIUM PHOSPHATE 10 MG/ML IJ SOLN
INTRAMUSCULAR | Status: DC | PRN
  Administered 2018-08-15: 8 mg via INTRAVENOUS

## 2018-08-15 MED ORDER — POLYVINYL ALCOHOL 1.4 % OP SOLN: 1.0000 [drp] | OPHTHALMIC | Status: DC | PRN

## 2018-08-15 MED ORDER — PROPOFOL 10 MG/ML IV BOLUS
INTRAVENOUS | Status: AC
  Filled 2018-08-15: qty 40

## 2018-08-15 MED ORDER — ACETAMINOPHEN 325 MG PO TABS
325.0000 mg | ORAL_TABLET | Freq: Four times a day (QID) | ORAL | Status: DC | PRN
  Administered 2018-08-16 – 2018-08-18 (×3): 650 mg via ORAL
  Filled 2018-08-15 (×3): qty 2

## 2018-08-15 MED ORDER — CEFAZOLIN SODIUM-DEXTROSE 2-4 GM/100ML-% IV SOLN
2.0000 g | INTRAVENOUS | Status: AC
  Administered 2018-08-15: 2 g via INTRAVENOUS

## 2018-08-15 MED ORDER — CEFAZOLIN SODIUM-DEXTROSE 2-4 GM/100ML-% IV SOLN
2.0000 g | Freq: Four times a day (QID) | INTRAVENOUS | Status: AC
  Administered 2018-08-15 (×2): 2 g via INTRAVENOUS
  Filled 2018-08-15 (×2): qty 100

## 2018-08-15 MED ORDER — HYDROMORPHONE HCL 1 MG/ML IJ SOLN
0.5000 mg | INTRAMUSCULAR | Status: DC | PRN
  Administered 2018-08-15: 0.5 mg via INTRAVENOUS
  Filled 2018-08-15: qty 1

## 2018-08-15 MED ORDER — BISACODYL 5 MG PO TBEC: 5.0000 mg | DELAYED_RELEASE_TABLET | Freq: Every day | ORAL | Status: DC | PRN

## 2018-08-15 MED ORDER — LIDOCAINE 2% (20 MG/ML) 5 ML SYRINGE
INTRAMUSCULAR | Status: AC
  Filled 2018-08-15: qty 5

## 2018-08-15 MED ORDER — ASPIRIN 81 MG PO CHEW
81.0000 mg | CHEWABLE_TABLET | Freq: Two times a day (BID) | ORAL | Status: DC
  Administered 2018-08-15 – 2018-08-18 (×6): 81 mg via ORAL
  Filled 2018-08-15 (×6): qty 1

## 2018-08-15 MED ORDER — HYDROMORPHONE HCL 1 MG/ML IJ SOLN
0.5000 mg | INTRAMUSCULAR | Status: DC | PRN
  Administered 2018-08-15 – 2018-08-16 (×5): 1 mg via INTRAVENOUS
  Filled 2018-08-15 (×5): qty 1

## 2018-08-15 MED ORDER — DIPHENHYDRAMINE HCL 12.5 MG/5ML PO ELIX: 12.5000 mg | ORAL_SOLUTION | ORAL | Status: DC | PRN

## 2018-08-15 MED ORDER — METOCLOPRAMIDE HCL 5 MG PO TABS: 5.0000 mg | ORAL_TABLET | Freq: Three times a day (TID) | ORAL | Status: DC | PRN

## 2018-08-15 MED ORDER — TRANEXAMIC ACID-NACL 1000-0.7 MG/100ML-% IV SOLN
1000.0000 mg | INTRAVENOUS | Status: AC
  Administered 2018-08-15: 1000 mg via INTRAVENOUS

## 2018-08-15 MED ORDER — FENTANYL CITRATE (PF) 100 MCG/2ML IJ SOLN: 25.0000 ug | INTRAMUSCULAR | Status: DC | PRN

## 2018-08-15 SURGICAL SUPPLY — 76 items
ATTUNE MED DOME PAT 38 KNEE (Knees) ×1 IMPLANT
ATTUNE PSFEM RTSZ6 NARCEM KNEE (Femur) ×1 IMPLANT
ATTUNE PSRP INSR SZ6 7 KNEE (Insert) ×1 IMPLANT
BAG DECANTER FOR FLEXI CONT (MISCELLANEOUS) ×2 IMPLANT
BAG ZIPLOCK 12X15 (MISCELLANEOUS) IMPLANT
BANDAGE ACE 4X5 VEL STRL LF (GAUZE/BANDAGES/DRESSINGS) ×2 IMPLANT
BANDAGE ACE 6X5 VEL STRL LF (GAUZE/BANDAGES/DRESSINGS) ×2 IMPLANT
BANDAGE ELASTIC 4 VELCRO ST LF (GAUZE/BANDAGES/DRESSINGS) ×1 IMPLANT
BASE TIBIAL ROT PLAT SZ 5 KNEE (Knees) IMPLANT
BLADE SAG 18X100X1.27 (BLADE) ×2 IMPLANT
BLADE SAW SGTL 11.0X1.19X90.0M (BLADE) ×2 IMPLANT
BLADE SAW SGTL 13.0X1.19X90.0M (BLADE) ×2 IMPLANT
BLADE SURG SZ10 CARB STEEL (BLADE) ×4 IMPLANT
BNDG ELASTIC 6X10 VLCR STRL LF (GAUZE/BANDAGES/DRESSINGS) ×1 IMPLANT
CEMENT HV SMART SET (Cement) ×4 IMPLANT
COVER SURGICAL LIGHT HANDLE (MISCELLANEOUS) ×2 IMPLANT
COVER WAND RF STERILE (DRAPES) IMPLANT
CUFF TOURN SGL QUICK 34 (TOURNIQUET CUFF) ×1
CUFF TRNQT CYL 34X4X40X1 (TOURNIQUET CUFF) ×1 IMPLANT
DECANTER SPIKE VIAL GLASS SM (MISCELLANEOUS) ×2 IMPLANT
DRAPE INCISE IOBAN 66X45 STRL (DRAPES) IMPLANT
DRAPE ORTHO SPLIT 77X108 STRL (DRAPES) ×2
DRAPE SHEET LG 3/4 BI-LAMINATE (DRAPES) ×4 IMPLANT
DRAPE SURG ORHT 6 SPLT 77X108 (DRAPES) ×2 IMPLANT
DRAPE U-SHAPE 47X51 STRL (DRAPES) ×2 IMPLANT
DRSG AQUACEL AG ADV 3.5X10 (GAUZE/BANDAGES/DRESSINGS) ×1 IMPLANT
DRSG TEGADERM 4X4.75 (GAUZE/BANDAGES/DRESSINGS) IMPLANT
DURAPREP 26ML APPLICATOR (WOUND CARE) ×2 IMPLANT
ELECT BLADE TIP CTD 4 INCH (ELECTRODE) ×2 IMPLANT
ELECT REM PT RETURN 15FT ADLT (MISCELLANEOUS) ×2 IMPLANT
EVACUATOR 1/8 PVC DRAIN (DRAIN) IMPLANT
GAUZE SPONGE 2X2 8PLY STRL LF (GAUZE/BANDAGES/DRESSINGS) IMPLANT
GLOVE BIOGEL PI IND STRL 7.5 (GLOVE) ×1 IMPLANT
GLOVE BIOGEL PI IND STRL 8 (GLOVE) ×1 IMPLANT
GLOVE BIOGEL PI INDICATOR 7.5 (GLOVE) ×1
GLOVE BIOGEL PI INDICATOR 8 (GLOVE) ×1
GLOVE SURG SS PI 7.5 STRL IVOR (GLOVE) ×4 IMPLANT
GLOVE SURG SS PI 8.0 STRL IVOR (GLOVE) ×4 IMPLANT
GOWN STRL REUS W/TWL XL LVL3 (GOWN DISPOSABLE) ×4 IMPLANT
HANDPIECE INTERPULSE COAX TIP (DISPOSABLE) ×1
HEMOSTAT SPONGE AVITENE ULTRA (HEMOSTASIS) ×1 IMPLANT
HOLDER FOLEY CATH W/STRAP (MISCELLANEOUS) IMPLANT
IMMOBILIZER KNEE 20 (SOFTGOODS) ×2
IMMOBILIZER KNEE 20 THIGH 36 (SOFTGOODS) ×1 IMPLANT
IMMOBILIZER KNEE 22 UNIV (SOFTGOODS) ×1 IMPLANT
MANIFOLD NEPTUNE II (INSTRUMENTS) ×2 IMPLANT
NDL SAFETY ECLIPSE 18X1.5 (NEEDLE) IMPLANT
NEEDLE HYPO 18GX1.5 SHARP (NEEDLE)
NS IRRIG 1000ML POUR BTL (IV SOLUTION) IMPLANT
PACK TOTAL KNEE CUSTOM (KITS) ×2 IMPLANT
PROTECTOR NERVE ULNAR (MISCELLANEOUS) ×2 IMPLANT
SEALER BIPOLAR AQUA 6.0 (INSTRUMENTS) IMPLANT
SET HNDPC FAN SPRY TIP SCT (DISPOSABLE) ×1 IMPLANT
SPONGE GAUZE 2X2 STER 10/PKG (GAUZE/BANDAGES/DRESSINGS)
SPONGE SURGIFOAM ABS GEL 100 (HEMOSTASIS) IMPLANT
STAPLER VISISTAT (STAPLE) IMPLANT
STRIP CLOSURE SKIN 1/2X4 (GAUZE/BANDAGES/DRESSINGS) IMPLANT
SUT BONE WAX W31G (SUTURE) ×1 IMPLANT
SUT MNCRL AB 4-0 PS2 18 (SUTURE) IMPLANT
SUT STRATAFIX 0 PDS 27 VIOLET (SUTURE) ×2
SUT VIC AB 1 CT1 27 (SUTURE) ×2
SUT VIC AB 1 CT1 27XBRD ANTBC (SUTURE) ×2 IMPLANT
SUT VIC AB 1 CTX 36 (SUTURE)
SUT VIC AB 1 CTX36XBRD ANBCTR (SUTURE) IMPLANT
SUT VIC AB 2-0 CT1 27 (SUTURE) ×3
SUT VIC AB 2-0 CT1 TAPERPNT 27 (SUTURE) ×3 IMPLANT
SUTURE STRATFX 0 PDS 27 VIOLET (SUTURE) ×1 IMPLANT
SYR 3ML LL SCALE MARK (SYRINGE) IMPLANT
SYR 50ML LL SCALE MARK (SYRINGE) IMPLANT
TIBIAL BASE ROT PLAT SZ 5 KNEE (Knees) ×2 IMPLANT
TOWER CARTRIDGE SMART MIX (DISPOSABLE) ×2 IMPLANT
TRAY FOLEY MTR SLVR 16FR STAT (SET/KITS/TRAYS/PACK) ×2 IMPLANT
WATER STERILE IRR 1000ML POUR (IV SOLUTION) ×2 IMPLANT
WIPE CHG CHLORHEXIDINE 2% (PERSONAL CARE ITEMS) ×1 IMPLANT
WRAP KNEE MAXI GEL POST OP (GAUZE/BANDAGES/DRESSINGS) ×2 IMPLANT
YANKAUER SUCT BULB TIP 10FT TU (MISCELLANEOUS) ×2 IMPLANT

## 2018-08-15 NOTE — Discharge Instructions (Signed)

## 2018-08-15 NOTE — Anesthesia Procedure Notes (Signed)
Anesthesia Regional Block: Adductor canal block   Pre-Anesthetic Checklist: ,, timeout performed, Correct Patient, Correct Site, Correct Laterality, Correct Procedure, Correct Position, site marked, Risks and benefits discussed,  Surgical consent,  Pre-op evaluation,  At surgeon's request and post-op pain management  Laterality: Right  Prep: chloraprep       Needles:  Injection technique: Single-shot  Needle Type: Echogenic Needle     Needle Length: 9cm  Needle Gauge: 21     Additional Needles:   Procedures:,,,, ultrasound used (permanent image in chart),,,,  Narrative:  Start time: 08/15/2018 8:20 AM End time: 08/15/2018 8:25 AM Injection made incrementally with aspirations every 5 mL.  Performed by: Personally  Anesthesiologist: Suzette Battiest, MD

## 2018-08-15 NOTE — Op Note (Signed)
NAME: [name], [name] MEDICAL RECORD DU:[id number] ACCOUNT [id number] DATE OF BIRTH:[birth year] FACILITY: [facility] LOCATION: [facility] PHYSICIAN:Voncille Simm Windy Kalata, MD  OPERATIVE REPORT  DATE OF PROCEDURE:  08/15/2018  PREOPERATIVE DIAGNOSIS:  End-stage osteoarthritis of the right knee.  POSTOPERATIVE DIAGNOSIS:  End-stage osteoarthritis of the right knee.  PROCEDURE PERFORMED:  Right total knee arthroplasty.  ANESTHESIA:  Spinal.  ASSISTANT:  Lacie Draft, PA  HISTORY:  An 83 year old with bone-on-bone arthrosis medial compartment and varus deformity, refractory to conservative treatment including rest, activity modification, injections and physical therapy.  Negative affect to her activities of daily living  including utilization of a cane.  Indicated for replacement of the degenerative joint.  Risks and benefits discussed including bleeding, infection, damage to neurovascular structure, suboptimal range of motion, DVT, PE, arthritis, fibrosis, need for  revision, etc.  TECHNIQUE:  With the patient in supine position after induction of adequate spinal anesthesia and 2 g Kefzol, the right lower extremity was prepped, draped, and exsanguinated in usual sterile fashion.  A thigh tourniquet was inflated to 250 mmHg.  A  midline incision was then made over the patella.  Full-thickness flaps were developed.  A median parapatellar arthrotomy was performed.  Elevated soft tissues medially, preserving the MCL.  The patella everted, knee flexed.  Tricompartmental  osteoarthrosis, particularly medial compartment with eburnated bone.  We removed the remnants of medial and lateral menisci, debrided the fat pad.  Step drill was utilized and the femoral canal was irrigated.  A 5-degree right was utilized with 9 off the  distal femur.  It was pinned.  We performed a distal femoral cut, protecting the soft tissues at all times.  I then sized the femur off the anterior cortex to be a 6 and 3 degrees of  external rotation.  This was pinned, and I performed the anterior,  posterior and chamfer cuts with soft tissues protected at all times.  Subluxed the tibia.  External alignment guide throughout the defect which was 10 off the high side, parallel to the shaft bisecting the tibiotalar joint.  Three-degree slope.  This was  pinned. We performed our proximal tibial cut, protecting the soft tissues.  We then checked our flexion and extension gaps.  They were identical and symmetrical.  Then subluxed the tibia, measured the femur to a 5, maximizing the coverage.  I placed a  distal medial aspect of the tibial tubercle.  I was pinned.  Harvested bone centrally from the canal.  We then utilized our punch guide and drilling centrally.  I placed the bone graft in the femoral canal after a box cut was then made in the femur,  bisecting the canal.  This was performed without difficulty.  I then placed a trial femur, drilled the lug holes, and then utilized a 6 mm insert.  It reduced the knee.  It had full extension, full flexion, good stability with varus-valgus stress in 0 to  30 degrees.  Negative anterior drawer.  Everted the patella.  It was measured to a 22, planed to a 15 utilizing the external alignment guide.  It sized to a 38, medializing our drill holes.  The template when reduced was parallel to the joint.  I placed  a trial patella, reduced it, and had excellent patellofemoral tracking.  All instrumentation was removed.  I checked posteriorly.  The popliteus was intact.  I cauterized the geniculates.  The capsule was intact.  Pulsatile lavage, copiously irrigated.   Flexed the knee.  All surfaces thoroughly dried.  Next, cement on the back table in appropriate fashion.  Injected into the tibia, digitally pressurizing the cement, impacted the tibial tray.   Cement was placed in the tibial tray.  Redundant cement  removed.  Cement impacted the femur.  Redundant cement removed.  I placed a trial 7 insert,  reduced it and held an axial load throughout the curing of the cement.  I then cemented and clamped the patella.  After appropriate curing of the cement, Marcaine  0.25% with epinephrine in the joint and irrigated into the joint.  Then, the tourniquet was deflated at 54 minutes.  Minimal bleeding was cauterized.  After assessing the knee with full flexion and extension, I felt the 7 was maximum.  I then removed  it.  I meticulously removed any redundant cement.  We then irrigated the wound and placed a 7 permanent insert, reduced.  I had full extension, full flexion, good stability with varus-valgus stress in 0 to 30 degrees, negative anterior drawer.  I then  flexed the knee and reapproximated the patellar arthrotomy with #1 Vicryl interrupted figure-of-eight sutures after irrigation, then oversewn with a Stratafix.  I then assessed the patellofemoral tracking, and it was satisfactory.  Again, good stability.   Copiously irrigated the wound.  Multiple layers of 2-0.  Due to the patient's size and skin, we utilized staples for the skin.  She had flexion to gravity at 90 degrees.  A sterile dressing was applied, placed in immobilizer, and transported to the  recovery room in satisfactory condition.  The patient tolerated the procedure well.  No complications.  Assistant Lacie Draft, [place].  Minimal blood loss.  LN/NUANCE  D:08/15/2018 T:08/15/2018 JOB:[id number]

## 2018-08-15 NOTE — Interval H&P Note (Signed)
History and Physical Interval Note:  08/15/2018 8:21 AM  [name]  has presented today for surgery, with the diagnosis of Dengerative joint disease right knee  The various methods of treatment have been discussed with the patient and family. After consideration of risks, benefits and other options for treatment, the patient has consented to  Procedure(s) with comments: TOTAL KNEE ARTHROPLASTY (Right) - 120 minutes as a surgical intervention .  The patient's history has been reviewed, patient examined, no change in status, stable for surgery.  I have reviewed the patient's chart and labs.  Questions were answered to the patient's satisfaction.     [name]

## 2018-08-15 NOTE — Brief Op Note (Signed)
08/15/2018  10:24 AM  PATIENT:  Trish Mage  83 y.o. female  PRE-OPERATIVE DIAGNOSIS:  Dengerative joint disease right knee  POST-OPERATIVE DIAGNOSIS:  degenerative jooint disease right knee  PROCEDURE:  Procedure(s) with comments: TOTAL KNEE ARTHROPLASTY (Right) - 120 minutes  SURGEON:  Surgeon(s) and Role:    Susa Day, MD - Primary  PHYSICIAN ASSISTANT:   ASSISTANTS: [place]   ANESTHESIA:   general  EBL:  20 mL   BLOOD ADMINISTERED:none  DRAINS: none   LOCAL MEDICATIONS USED:  MARCAINE     SPECIMEN:  No Specimen  DISPOSITION OF SPECIMEN:  N/A  COUNTS:  YES  TOURNIQUET:   Total Tourniquet Time Documented: Thigh (Right) - 54 minutes Total: Thigh (Right) - 54 minutes   DICTATION: .Other Dictation: Dictation Number   [id number]  PLAN OF CARE: Admit to inpatient   PATIENT DISPOSITION:  PACU - hemodynamically stable.   Delay start of Pharmacological VTE agent (>24hrs) due to surgical blood loss or risk of bleeding: no

## 2018-08-15 NOTE — Anesthesia Postprocedure Evaluation (Signed)
Anesthesia Post Note  Patient: [name]  Procedure(s) Performed: TOTAL KNEE ARTHROPLASTY (Right Knee)     Patient location during evaluation: PACU Anesthesia Type: Spinal Level of consciousness: awake and alert Pain management: pain level controlled Vital Signs Assessment: post-procedure vital signs reviewed and stable Respiratory status: spontaneous breathing and respiratory function stable Cardiovascular status: blood pressure returned to baseline and stable Postop Assessment: spinal receding Anesthetic complications: no    Last Vitals:  Vitals:   08/15/18 1145 08/15/18 1228  BP: 130/73 139/71  Pulse: (!) 55 60  Resp: 15 16  Temp:  36.4 C  SpO2: 97% 99%    Last Pain:  Vitals:   08/15/18 1228  TempSrc: Oral  PainSc:                  [name]

## 2018-08-15 NOTE — Evaluation (Signed)
Physical Therapy Evaluation Patient Details Name: [name] MRN: [id number] DOB: 11/16/1935 Today's Date: 08/15/2018   History of Present Illness  83 yo female s/p R TKR on 08/15/18. PMH includes OA, measles, mumps, R knee arthroscopy, RTC repair.   Clinical Impression  Pt presents with R knee pain, decreased R knee ROM limited by pain and stiffness, difficulty performing mobility tasks, and decreased tolerance for ambulation due to pain and nausea . Pt to benefit from acute PT to address deficits. Pt ambulated 6 ft with RW with min guard assist, had to stop due to nausea and R knee pain. Pt educated on quad sets (5-10/hour), ankle pumps (20/hour), and heel slides (5-10/hour) to perform this afternoon/evening to lessen stiffness and increase circulation, to pt's tolerance and limited by pain. PT to progress mobility as tolerated, and will continue to follow acutely.      Follow Up Recommendations Follow surgeon's recommendation for DC plan and follow-up therapies;Supervision for mobility/OOB    Equipment Recommendations  Rolling walker with 5" wheels    Recommendations for Other Services       Precautions / Restrictions Precautions Precautions: Fall Restrictions Weight Bearing Restrictions: No Other Position/Activity Restrictions: WBAT       Mobility  Bed Mobility Overal bed mobility: Needs Assistance Bed Mobility: Supine to Sit     Supine to sit: Min assist;HOB elevated     General bed mobility comments: Min assist for RLE management, increased time. Verbal cuing for sequencing.   Transfers Overall transfer level: Needs assistance Equipment used: Rolling walker (2 wheeled) Transfers: Sit to/from Stand Sit to Stand: Min guard;From elevated surface         General transfer comment: Min guard for safety. Verbal cuing for hand placement, not followed. pt with self-steadying upon standing.   Ambulation/Gait Ambulation/Gait assistance: Min guard;+2  safety/equipment(chair follow) Gait Distance (Feet): 6 Feet Assistive device: Rolling walker (2 wheeled) Gait Pattern/deviations: Step-to pattern;Decreased stance time - right;Decreased weight shift to right;Antalgic Gait velocity: decr   General Gait Details: Min guard for safety, close guarding of R knee in case of buckling (none present). Verbal cuing for sequencing, placement in RW. Pt stated after 6 ft ambulation that she felt nauseous and needed to sit in chair follow. Pt with dry heaving but no emesis, nausea improved with rest.   Stairs            Wheelchair Mobility    Modified Rankin (Stroke Patients Only)       Balance Overall balance assessment: Needs assistance Sitting-balance support: No upper extremity supported Sitting balance-Leahy Scale: Good     Standing balance support: Bilateral upper extremity supported Standing balance-Leahy Scale: Poor                               Pertinent Vitals/Pain Pain Assessment: 0-10 Pain Score: 5  Pain Location: R knee  Pain Descriptors / Indicators: Throbbing Pain Intervention(s): Limited activity within patient's tolerance;Repositioned;Ice applied;Monitored during session;Premedicated before session    Home Living Family/patient expects to be discharged to:: Private residence Living Arrangements: Spouse/significant other Available Help at Discharge: Family;Available 24 hours/day Type of Home: House Home Access: Stairs to enter   [employer] of Steps: 2 Home Layout: One level Home Equipment: Bedside commode      Prior Function Level of Independence: Independent               Hand Dominance   Dominant Hand: Right  Extremity/Trunk Assessment   Upper Extremity Assessment Upper Extremity Assessment: Overall WFL for tasks assessed    Lower Extremity Assessment Lower Extremity Assessment: Generalized weakness;RLE deficits/detail RLE Deficits / Details: suspected post-surgical  weakness; able to perform ankle pumps, quad set, SLR with lift assist, heel slide to 45* RLE Sensation: WNL    Cervical / Trunk Assessment Cervical / Trunk Assessment: Normal  Communication   Communication: No difficulties  Cognition Arousal/Alertness: Awake/alert Behavior During Therapy: WFL for tasks assessed/performed Overall Cognitive Status: Within Functional Limits for tasks assessed                                        General Comments      Exercises Total Joint Exercises Goniometric ROM: knee aarom ~5-45*, limited by pain    Assessment/Plan    PT Assessment Patient needs continued PT services  PT Problem List Decreased strength;Pain;Decreased range of motion;Decreased activity tolerance;Decreased knowledge of use of DME;Decreased balance;Decreased mobility       PT Treatment Interventions DME instruction;Therapeutic activities;Gait training;Therapeutic exercise;Patient/family education;Stair training;Balance training;Functional mobility training    PT Goals (Current goals can be found in the Care Plan section)  Acute Rehab PT Goals Patient Stated Goal: decrease R knee pain  PT Goal Formulation: With patient Time For Goal Achievement: 08/22/18 Potential to Achieve Goals: Good    Frequency 7X/week   Barriers to discharge        Co-evaluation               AM-PAC PT "6 Clicks" Mobility  Outcome Measure Help needed turning from your back to your side while in a flat bed without using bedrails?: A Little Help needed moving from lying on your back to sitting on the side of a flat bed without using bedrails?: A Little Help needed moving to and from a bed to a chair (including a wheelchair)?: A Little Help needed standing up from a chair using your arms (e.g., wheelchair or bedside chair)?: A Little Help needed to walk in hospital room?: A Little Help needed climbing 3-5 steps with a railing? : A Lot 6 Click Score: 17    End of Session  Equipment Utilized During Treatment: Gait belt Activity Tolerance: Patient limited by pain;Other (comment)(pt limited by nausea) Patient left: in chair;with chair alarm set;with call bell/phone within reach;with family/visitor present;with SCD's reapplied(NT in room, to reapply SCDs and pulse ox) Nurse Communication: Mobility status PT Visit Diagnosis: Other abnormalities of gait and mobility (R26.89);Difficulty in walking, not elsewhere classified (R26.2)    Time: 1726-1740 PT Time Calculation (min) (ACUTE ONLY): 14 min   Charges:   PT Evaluation $PT Eval Low Complexity: 1 Low          Chaniah Cisse Conception Chancy, PT Acute Rehabilitation Services Pager [phone]  Office [phone]   Breeze Angell D Elonda Husky 08/15/2018, 7:08 PM

## 2018-08-15 NOTE — Anesthesia Procedure Notes (Signed)
Spinal  Start time: 08/15/2018 8:40 AM End time: 08/15/2018 8:45 AM Staffing Resident/CRNA: Garrel Ridgel, CRNA Performed: resident/CRNA  Preanesthetic Checklist Completed: patient identified, site marked, surgical consent, pre-op evaluation, timeout performed, IV checked, risks and benefits discussed and monitors and equipment checked Spinal Block Patient position: sitting Prep: Betadine Patient monitoring: heart rate, continuous pulse ox and blood pressure Approach: midline Location: L4-5 Injection technique: single-shot Needle Needle type: Spinocan  Needle length: 9 cm Needle insertion depth: 5 cm Assessment Sensory level: T8

## 2018-08-15 NOTE — Transfer of Care (Signed)
Immediate Anesthesia Transfer of Care Note  Patient: [name]  Procedure(s) Performed: TOTAL KNEE ARTHROPLASTY (Right Knee)  Patient Location: PACU  Anesthesia Type:Spinal  Level of Consciousness: awake and alert   Airway & Oxygen Therapy: Patient Spontanous Breathing and Patient connected to face mask oxygen  Post-op Assessment: Report given to RN and Post -op Vital signs reviewed and stable  Post vital signs: Reviewed and stable  Last Vitals:  Vitals Value Taken Time  BP 116/61 08/15/2018 10:46 AM  Temp    Pulse 67 08/15/2018 10:49 AM  Resp 12 08/15/2018 10:49 AM  SpO2 98 % 08/15/2018 10:49 AM  Vitals shown include unvalidated device data.  Last Pain: There were no vitals filed for this visit.    Patients Stated Pain Goal: 4 (05/27/24 3664)  Complications: No apparent anesthesia complications

## 2018-08-16 DIAGNOSIS — M1711 Unilateral primary osteoarthritis, right knee: Secondary | ICD-10-CM | POA: Diagnosis not present

## 2018-08-16 LAB — BASIC METABOLIC PANEL WITH GFR
BUN: 17 mg/dL (ref 8–23)
Creatinine, Ser: 0.73 mg/dL (ref 0.44–1.00)

## 2018-08-16 LAB — CBC
HCT: 38.5 % (ref 36.0–46.0)
Hemoglobin: 12.2 g/dL (ref 12.0–15.0)
MCH: 29.8 pg (ref 26.0–34.0)
MCHC: 31.7 g/dL (ref 30.0–36.0)
MCV: 93.9 fL (ref 80.0–100.0)
Platelets: 307 K/uL (ref 150–400)
RBC: 4.1 MIL/uL (ref 3.87–5.11)
RDW: 14.4 % (ref 11.5–15.5)
WBC: 16 10*3/uL — ABNORMAL HIGH (ref 4.0–10.5)
nRBC: 0 % (ref 0.0–0.2)

## 2018-08-16 LAB — BASIC METABOLIC PANEL
Anion gap: 7 (ref 5–15)
CO2: 25 mmol/L (ref 22–32)
Calcium: 8.3 mg/dL — ABNORMAL LOW (ref 8.9–10.3)
Chloride: 103 mmol/L (ref 98–111)
GFR calc Af Amer: >60 mL/min (ref >60)
GFR calc non Af Amer: >60 mL/min (ref >60)
Glucose, Bld: 148 mg/dL — ABNORMAL HIGH (ref 70–99)
Potassium: 4.5 mmol/L (ref 3.5–5.1)
Sodium: 135 mmol/L (ref 135–145)

## 2018-08-16 NOTE — Care Management CC44 (Signed)
Condition Code 44 Documentation Completed  Patient Details  Name: ERIS HANNAN MRN: [id number] Date of Birth: 01/04/1936   Condition Code 44 given:  Yes Patient signature on Condition Code 44 notice:    Documentation of 2 MD's agreement:  Yes Code 44 added to claim:  Yes    Dessa Phi, RN 08/16/2018, 10:02 AM

## 2018-08-16 NOTE — Progress Notes (Signed)
CSW consult-SNF  Plan: Home Health CSW signing off.  Kathrin Greathouse, Marlinda Mike, MSW Clinical Social Worker  [phone] 08/16/2018  11:35 AM

## 2018-08-16 NOTE — Progress Notes (Signed)
Physical Therapy Treatment Patient Details Name: [name] MRN: [id number] DOB: 08/11/1935 Today's Date: 08/16/2018    History of Present Illness 83 yo female s/p R TKR on 08/15/18. PMH includes OA, measles, mumps, R knee arthroscopy, RTC repair.     PT Comments    Pt limited d/t N/V/dizziness; RN aware, see below for details   Follow Up Recommendations  Follow surgeon's recommendation for DC plan and follow-up therapies;Supervision for mobility/OOB     Equipment Recommendations  Rolling walker with 5" wheels    Recommendations for Other Services       Precautions / Restrictions Precautions Precautions: Fall Restrictions Weight Bearing Restrictions: No    Mobility  Bed Mobility Overal bed mobility: Needs Assistance Bed Mobility: Supine to Sit     Supine to sit: Supervision;HOB elevated     General bed mobility comments: incr time, cues to self assist  Transfers Overall transfer level: Needs assistance Equipment used: Rolling walker (2 wheeled) Transfers: Sit to/from [facility] Sit to Stand: Min assist Stand pivot transfers: Min assist       General transfer comment: cues for hand placmeent; sit to stand x2 d/t dizziness, N/V; min assist to steady during pivot to chair; (BP 123/69)  Ambulation/Gait             General Gait Details: deferred d/t pain, N/V and dizziness   Stairs             Wheelchair Mobility    Modified Rankin (Stroke Patients Only)       Balance Overall balance assessment: Needs assistance           Standing balance-Leahy Scale: Poor Standing balance comment: reliant on UEs                            Cognition Arousal/Alertness: Awake/alert Behavior During Therapy: WFL for tasks assessed/performed Overall Cognitive Status: Within Functional Limits for tasks assessed                                        Exercises Total Joint Exercises Ankle  Circles/Pumps: AROM;Both;10 reps Quad Sets: AROM;Both;10 reps    General Comments        Pertinent Vitals/Pain Pain Score: 7  Pain Location: R knee  Pain Descriptors / Indicators: Grimacing;Guarding;Discomfort Pain Intervention(s): Monitored during session;Repositioned    Home Living                      Prior Function            PT Goals (current goals can now be found in the care plan section) Acute Rehab PT Goals Patient Stated Goal: decrease R knee pain  PT Goal Formulation: With patient Time For Goal Achievement: 08/22/18 Potential to Achieve Goals: Good Progress towards PT goals: Progressing toward goals    Frequency    7X/week      PT Plan Current plan remains appropriate    Co-evaluation              AM-PAC PT "6 Clicks" Mobility   Outcome Measure  Help needed turning from your back to your side while in a flat bed without using bedrails?: A Little Help needed moving from lying on your back to sitting on the side of a flat bed without using bedrails?: A Little Help needed moving to  and from a bed to a chair (including a wheelchair)?: A Little Help needed standing up from a chair using your arms (e.g., wheelchair or bedside chair)?: A Little Help needed to walk in hospital room?: A Little Help needed climbing 3-5 steps with a railing? : A Lot 6 Click Score: 17    End of Session Equipment Utilized During Treatment: Gait belt Activity Tolerance: Patient limited by pain;Other (comment)(dizziness) Patient left: in chair;with call bell/phone within reach;with family/visitor present Nurse Communication: Mobility status PT Visit Diagnosis: Other abnormalities of gait and mobility (R26.89);Difficulty in walking, not elsewhere classified (R26.2)     Time: 7408-1448 PT Time Calculation (min) (ACUTE ONLY): 24 min  Charges:  $Gait Training: 8-22 mins $Therapeutic Activity: 8-22 mins                     Kenyon Ana, PT  Pager:  [phone] Acute Rehab Dept [facility]): [phone]   08/16/2018    [facility] 08/16/2018, 1:11 PM

## 2018-08-16 NOTE — Progress Notes (Signed)
08/16/18 1400  PT Visit Information  Last PT Received On 08/16/18 pt is progressing well this pm, pain and nausea controlled; will benefit from additional session tomorrow with PT to  incr gait distance, perform stair training and overall safety  Assistance Needed +1  History of Present Illness 83 yo female s/p R TKR on 08/15/18. PMH includes OA, measles, mumps, R knee arthroscopy, RTC repair.   Subjective Data  Patient Stated Goal decrease R knee pain   Precautions  Precautions Fall  Restrictions  Weight Bearing Restrictions No  Pain Assessment  Pain Assessment 0-10  Pain Score 4  Pain Location R knee   Pain Descriptors / Indicators Grimacing;Guarding;Discomfort  Pain Intervention(s) Limited activity within patient's tolerance;Monitored during session;Premedicated before session;Ice applied;Repositioned  Cognition  Arousal/Alertness Awake/alert  Behavior During Therapy WFL for tasks assessed/performed  Overall Cognitive Status Within Functional Limits for tasks assessed  Bed Mobility  Overal bed mobility Needs Assistance  Bed Mobility Sit to Supine  Sit to supine Supervision;Min guard  General bed mobility comments incr time, cues to self assist  Transfers  Overall transfer level Needs assistance  Equipment used Rolling walker (2 wheeled)  Transfers Sit to/from Stand  Sit to Stand Min assist;Min guard  General transfer comment cues for hand placmeent; sit to stand x2 d/t dizziness, N/V; min assist to steady during pivot to chair; (BP 123/69)  Ambulation/Gait  Ambulation/Gait assistance Min guard;Min assist  Gait Distance (Feet) 50 Feet  Assistive device Rolling walker (2 wheeled)  Gait Pattern/deviations Step-to pattern;Decreased stance time - right;Decreased weight shift to right;Antalgic  General Gait Details cues for sequence and RW safety  Balance  Overall balance assessment Needs assistance  Standing balance-Leahy Scale Poor  Standing balance comment reliant on UEs   Total Joint Exercises  Goniometric ROM grossly ~5* to 75* AAROM right knee flexion  Ankle Circles/Pumps AROM;Both;10 reps  Quad Sets AROM;Both;10 reps  Short Arc Quad AROM;Right;10 reps  Heel Slides AAROM;Right;10 reps;AROM  Hip ABduction/ADduction AAROM;Right;10 reps  Straight Leg Raises AROM;AAROM;Right;10 reps  PT - End of Session  Equipment Utilized During Treatment Gait belt  Activity Tolerance Patient tolerated treatment well  Patient left with call bell/phone within reach;with family/visitor present;in bed;with bed alarm set  Nurse Communication Mobility status   PT - Assessment/Plan  PT Plan Current plan remains appropriate  PT Visit Diagnosis Other abnormalities of gait and mobility (R26.89);Difficulty in walking, not elsewhere classified (R26.2)  PT Frequency (ACUTE ONLY) 7X/week  Follow Up Recommendations Follow surgeon's recommendation for DC plan and follow-up therapies;Supervision for mobility/OOB  PT equipment Rolling walker with 5" wheels  AM-PAC PT "6 Clicks" Mobility Outcome Measure (Version 2)  Help needed turning from your back to your side while in a flat bed without using bedrails? 3  Help needed moving from lying on your back to sitting on the side of a flat bed without using bedrails? 3  Help needed moving to and from a bed to a chair (including a wheelchair)? 3  Help needed standing up from a chair using your arms (e.g., wheelchair or bedside chair)? 3  Help needed to walk in hospital room? 3  Help needed climbing 3-5 steps with a railing?  2  6 Click Score 17  Consider Recommendation of Discharge To: Home with [facility]  PT Goal Progression  Progress towards PT goals Progressing toward goals  Acute Rehab PT Goals  PT Goal Formulation With patient  Time For Goal Achievement 08/22/18  Potential to Achieve Goals Good  PT Time Calculation  PT Start Time (ACUTE ONLY) 1348  PT Stop Time (ACUTE ONLY) 1408  PT Time Calculation (min) (ACUTE ONLY) 20 min  PT General  Charges  $$ ACUTE PT VISIT 1 Visit  PT Treatments  $Gait Training 8-22 mins

## 2018-08-16 NOTE — Progress Notes (Signed)
Subjective: 1 Day Post-Op Procedure(s) (LRB): TOTAL KNEE ARTHROPLASTY (Right) Patient reports pain as 3 on 0-10 scale.    Objective: Vital signs in last 24 hours: Temp:  [97.3 F (36.3 C)-98.4 F (36.9 C)] 98.4 F (36.9 C) (01/16 0523) Pulse Rate:  [55-73] 66 (01/16 0523) Resp:  [11-19] 17 (01/16 0523) BP: (115-147)/(61-91) 115/65 (01/16 0523) SpO2:  [94 %-100 %] 95 % (01/16 0523)  Intake/Output from previous day: 01/15 0701 - 01/16 0700 In: 3060 [P.O.:900; I.V.:2115.6; IV Piggyback:44.4] Out: 2425 [Urine:2375; Blood:50] Intake/Output this shift: No intake/output data recorded.  Recent Labs    08/16/18 0411  HGB 12.2   Recent Labs    08/16/18 0411  WBC 16.0*  RBC 4.10  HCT 38.5  PLT 307   Recent Labs    08/16/18 0411  NA 135  K 4.5  CL 103  CO2 25  BUN 17  CREATININE 0.73  GLUCOSE 148*  CALCIUM 8.3*   No results for input(s): LABPT, INR in the last 72 hours.  Neurologically intact Neurovascular intact Sensation intact distally Dorsiflexion/Plantar flexion intact Incision: dressing C/D/I and no drainage  No DVT  Assessment/Plan: 1 Day Post-Op Procedure(s) (LRB): TOTAL KNEE ARTHROPLASTY (Right) Advance diet Up with therapy D/C IV fluids Discharge home with home health   WBC from decadron, stress. No sign of infection    Johnn Hai 08/16/2018, 7:23 AM

## 2018-08-16 NOTE — Care Management Note (Signed)
Case Management Note  Patient Details  Name: [name] MRN: [id number] Date of Birth: July 04, 1936  Subjective/Objective: Primary OA R knee. From home w/spouse. Has 3n1. PT-f/u surgeons recc. Patient states she discussed plans to go home w/HHC-she chose [facility] if ordered HHPT-await orders.Home rw ordered & delivered to rm already.                   Action/Plan:d/c home/dme   Expected Discharge Date:                  Expected Discharge Plan:     In-House Referral:     Discharge planning Services     Post Acute Care Choice:  Durable Medical Equipment(has 3n1) Choice offered to:     DME Arranged:    DME Agency:     HH Arranged:    HH Agency:     Status of Service:  In process, will continue to follow  If discussed at Long Length of Stay Meetings, dates discussed:    Additional Comments:  Dessa Phi, RN 08/16/2018, 10:59 AM

## 2018-08-16 NOTE — Care Management Obs Status (Signed)
[facility] NOTIFICATION   Patient Details  Name: [name] MRN: [id number] Date of Birth: [birth year]   Medicare Observation Status Notification Given:  Yes    MahabirJuliann Pulse, RN 08/16/2018, 10:02 AM

## 2018-08-17 ENCOUNTER — Encounter (HOSPITAL_COMMUNITY): Payer: Self-pay | Admitting: Specialist

## 2018-08-17 DIAGNOSIS — M1711 Unilateral primary osteoarthritis, right knee: Secondary | ICD-10-CM | POA: Diagnosis not present

## 2018-08-17 LAB — CBC
HCT: 35.7 % — ABNORMAL LOW (ref 36.0–46.0)
Hemoglobin: 11.3 g/dL — ABNORMAL LOW (ref 12.0–15.0)
MCH: 30 pg (ref 26.0–34.0)
MCHC: 31.7 g/dL (ref 30.0–36.0)
MCV: 94.7 fL (ref 80.0–100.0)
Platelets: 253 10*3/uL (ref 150–400)
RBC: 3.77 MIL/uL — ABNORMAL LOW (ref 3.87–5.11)
RDW: 14.7 % (ref 11.5–15.5)
WBC: 11.7 K/uL — ABNORMAL HIGH (ref 4.0–10.5)
nRBC: 0 % (ref 0.0–0.2)

## 2018-08-17 MED ORDER — POLYETHYLENE GLYCOL 3350 17 G PO PACK: 17.0000 g | PACK | Freq: Every day | ORAL | 0 refills | Status: DC

## 2018-08-17 MED ORDER — ASPIRIN 81 MG PO CHEW: 81.0000 mg | CHEWABLE_TABLET | Freq: Two times a day (BID) | ORAL | 0 refills | Status: DC

## 2018-08-17 MED ORDER — KETOROLAC TROMETHAMINE 15 MG/ML IJ SOLN
15.0000 mg | Freq: Four times a day (QID) | INTRAMUSCULAR | Status: DC
  Administered 2018-08-17 – 2018-08-18 (×4): 15 mg via INTRAVENOUS
  Filled 2018-08-17 (×4): qty 1

## 2018-08-17 MED ORDER — ONDANSETRON HCL 4 MG PO TABS: 4.0000 mg | ORAL_TABLET | Freq: Four times a day (QID) | ORAL | 1 refills | Status: DC | PRN

## 2018-08-17 MED ORDER — DOCUSATE SODIUM 100 MG PO CAPS: 100.0000 mg | ORAL_CAPSULE | Freq: Two times a day (BID) | ORAL | 1 refills | Status: DC

## 2018-08-17 MED ORDER — OXYCODONE HCL 5 MG PO TABS: 5.0000 mg | ORAL_TABLET | ORAL | 0 refills | Status: DC | PRN

## 2018-08-17 NOTE — Plan of Care (Signed)
Plan of care reviewed and discussed with the patient. 

## 2018-08-17 NOTE — Progress Notes (Signed)
Subjective: 2 Days Post-Op Procedure(s) (LRB): TOTAL KNEE ARTHROPLASTY (Right) Patient reports pain as moderate and severe.   Difficult night due to pain. Needed IV dilaudid which seems to make her extremely nauseous each time she has received it. This AM has received oxy. Has required zofran overnight 2x and reglan this AM for nausea. C/o pain at the knee. Denies numbness or tingling. No other c/o. Voiding without difficulty. No CP, SOB, fever, chills. + Flatus no BM yet. Does not feel ready today for d/c due to pain.  Objective: Vital signs in last 24 hours: Temp:  [97.8 F (36.6 C)-98.5 F (36.9 C)] 98.5 F (36.9 C) (01/17 0450) Pulse Rate:  [58-74] 74 (01/17 0450) Resp:  [12-17] 17 (01/17 0450) BP: (123-147)/(59-68) 147/64 (01/17 0450) SpO2:  [94 %-98 %] 94 % (01/17 0450)  Intake/Output from previous day: 01/16 0701 - 01/17 0700 In: 1003.3 [P.O.:540; I.V.:463.3] Out: 750 [Urine:750] Intake/Output this shift: No intake/output data recorded.  Recent Labs    08/16/18 0411 08/17/18 0619  HGB 12.2 11.3*   Recent Labs    08/16/18 0411 08/17/18 0619  WBC 16.0* 11.7*  RBC 4.10 3.77*  HCT 38.5 35.7*  PLT 307 253   Recent Labs    08/16/18 0411  NA 135  K 4.5  CL 103  CO2 25  BUN 17  CREATININE 0.73  GLUCOSE 148*  CALCIUM 8.3*   No results for input(s): LABPT, INR in the last 72 hours.  Neurologically intact ABD soft Neurovascular intact Sensation intact distally Intact pulses distally Dorsiflexion/Plantar flexion intact Incision: dressing C/D/I and scant drainage No cellulitis present Compartment soft no calf pain or sign of DVT  Assessment/Plan: 2 Days Post-Op Procedure(s) (LRB): TOTAL KNEE ARTHROPLASTY (Right) Advance diet Up with therapy D/C IV fluids Add toradol Minimize use of dilaudid due to nausea if at all possible Anti-emetics prn Plan D/C home with HHPT tomorrow as long as pain well controlled and progressing well with PT Will discuss with  Dr. Valentino Saxon Deatra Robinson 08/17/2018, 8:32 AM

## 2018-08-17 NOTE — Progress Notes (Signed)
   08/17/18 1500  PT Visit Information  Last PT Received On 08/17/18---pt progressing slowly, continues to be limited by nausea; agreeable to exercises, sleepy and requiring verbal cues to stay alert for participation; deferred amb d/t movement increasing N/V; will see in am, pt hopefully will be ready for d/c tomorrow if medical issues resolve.   Assistance Needed +1  History of Present Illness 83 yo female s/p R TKR on 08/15/18. PMH includes OA, measles, mumps, R knee arthroscopy, RTC repair.   Subjective Data  Patient Stated Goal decrease R knee pain   Precautions  Precautions Fall;Knee  Restrictions  Other Position/Activity Restrictions WBAT   Pain Assessment  Pain Assessment 0-10  Pain Score 5  Pain Location R knee   Pain Descriptors / Indicators Grimacing;Guarding;Discomfort  Pain Intervention(s) Limited activity within patient's tolerance;Monitored during session;Ice applied  Cognition  Arousal/Alertness Awake/alert  Behavior During Therapy WFL for tasks assessed/performed  Overall Cognitive Status Within Functional Limits for tasks assessed  Total Joint Exercises  Goniometric ROM grossly -10* to 65* AAROM right knee flexion  Ankle Circles/Pumps AROM;Both;10 reps  Quad Sets AROM;Both;10 reps  Short Arc Quad AROM;Right;10 reps;AAROM  Heel Slides AAROM;Right;10 reps;AROM  Hip ABduction/ADduction AAROM;Right;10 reps  Straight Leg Raises AROM;AAROM;Right;10 reps  PT - End of Session  Activity Tolerance Other (comment) (nausea)  Patient left in bed;with call bell/phone within reach;with bed alarm set;with family/visitor present  Nurse Communication Mobility status   PT - Assessment/Plan  PT Plan Current plan remains appropriate  PT Visit Diagnosis Other abnormalities of gait and mobility (R26.89);Difficulty in walking, not elsewhere classified (R26.2)  PT Frequency (ACUTE ONLY) 7X/week  Follow Up Recommendations Follow surgeon's recommendation for DC plan and follow-up  therapies;Supervision for mobility/OOB  PT equipment Rolling walker with 5" wheels  AM-PAC PT "6 Clicks" Mobility Outcome Measure (Version 2)  Help needed turning from your back to your side while in a flat bed without using bedrails? 3  Help needed moving from lying on your back to sitting on the side of a flat bed without using bedrails? 3  Help needed moving to and from a bed to a chair (including a wheelchair)? 3  Help needed standing up from a chair using your arms (e.g., wheelchair or bedside chair)? 3  Help needed to walk in hospital room? 3  Help needed climbing 3-5 steps with a railing?  2  6 Click Score 17  Consider Recommendation of Discharge To: Home with [facility]  PT Goal Progression  Progress towards PT goals Progressing toward goals (slowly)  Acute Rehab PT Goals  PT Goal Formulation With patient  Time For Goal Achievement 08/22/18  Potential to Achieve Goals Good  PT Time Calculation  PT Start Time (ACUTE ONLY) 1509  PT Stop Time (ACUTE ONLY) 1529  PT Time Calculation (min) (ACUTE ONLY) 20 min  PT General Charges  $$ ACUTE PT VISIT 1 Visit  PT Treatments  $Therapeutic Exercise 8-22 mins

## 2018-08-17 NOTE — Discharge Summary (Signed)
Physician Discharge Summary   Patient ID: [name] MRN: [id number] DOB/AGE: [birth year] 83 y.o.  Admit date: 08/15/2018 Discharge date: 08/18/2018  Primary Diagnosis: right knee primary osteoarthritis  Admission Diagnoses:  Past Medical History:  Diagnosis Date  . Arthritis   . Elevated blood pressure reading 08/10/2018  . Sinus drainage    chronic ; "just makes me cough"    Discharge Diagnoses:   Principal Problem:   Primary osteoarthritis of right knee Active Problems:   Right knee DJD  Estimated body mass index is 39.48 kg/m as calculated from the following:   Height as of this encounter: 5\' 4"  (1.626 m).   Weight as of this encounter: 104.3 kg.  Procedure:  Procedure(s) (LRB): TOTAL KNEE ARTHROPLASTY (Right)   Consults: None  HPI: see H&P Laboratory Data: Admission on 08/15/2018  Component Date Value Ref Range Status  . WBC 08/16/2018 16.0* 4.0 - 10.5 K/uL Final  . RBC 08/16/2018 4.10  3.87 - 5.11 MIL/uL Final  . Hemoglobin 08/16/2018 12.2  12.0 - 15.0 g/dL Final  . HCT 08/16/2018 38.5  36.0 - 46.0 % Final  . MCV 08/16/2018 93.9  80.0 - 100.0 fL Final  . MCH 08/16/2018 29.8  26.0 - 34.0 pg Final  . MCHC 08/16/2018 31.7  30.0 - 36.0 g/dL Final  . RDW 08/16/2018 14.4  11.5 - 15.5 % Final  . Platelets 08/16/2018 307  150 - 400 K/uL Final  . nRBC 08/16/2018 0.0  0.0 - 0.2 % Final   Performed at [facility], [place] [address] [place], [place] [address]  . Sodium 08/16/2018 135  135 - 145 mmol/L Final  . Potassium 08/16/2018 4.5  3.5 - 5.1 mmol/L Final  . Chloride 08/16/2018 103  98 - 111 mmol/L Final  . CO2 08/16/2018 25  22 - 32 mmol/L Final  . Glucose, Bld 08/16/2018 148* 70 - 99 mg/dL Final  . BUN 08/16/2018 17  8 - 23 mg/dL Final  . Creatinine, Ser 08/16/2018 0.73  0.44 - 1.00 mg/dL Final  . Calcium 08/16/2018 8.3* 8.9 - 10.3 mg/dL Final  . GFR calc non Af Amer 08/16/2018 >60  >60 mL/min Final  . GFR calc Af Amer 08/16/2018 >60   >60 mL/min Final  . Anion gap 08/16/2018 7  5 - 15 Final   Performed at [facility], [place] [address], [place], [place] [address]  . WBC 08/17/2018 11.7* 4.0 - 10.5 K/uL Final  . RBC 08/17/2018 3.77* 3.87 - 5.11 MIL/uL Final  . Hemoglobin 08/17/2018 11.3* 12.0 - 15.0 g/dL Final  . HCT 08/17/2018 35.7* 36.0 - 46.0 % Final  . MCV 08/17/2018 94.7  80.0 - 100.0 fL Final  . MCH 08/17/2018 30.0  26.0 - 34.0 pg Final  . MCHC 08/17/2018 31.7  30.0 - 36.0 g/dL Final  . RDW 08/17/2018 14.7  11.5 - 15.5 % Final  . Platelets 08/17/2018 253  150 - 400 K/uL Final  . nRBC 08/17/2018 0.0  0.0 - 0.2 % Final   Performed at [facility], [place] [address] [place], [place] [address]  Hospital Outpatient Visit on 08/10/2018  Component Date Value Ref Range Status  . Color, Urine 08/10/2018 YELLOW  YELLOW Final  . APPearance 08/10/2018 CLEAR  CLEAR Final  . Specific Gravity, Urine 08/10/2018 1.011  1.005 - 1.030 Final  . pH 08/10/2018 5.0  5.0 - 8.0 Final  . Glucose, UA 08/10/2018 NEGATIVE  NEGATIVE mg/dL Final  . Hgb urine  dipstick 08/10/2018 MODERATE* NEGATIVE Final  . Bilirubin Urine 08/10/2018 NEGATIVE  NEGATIVE Final  . Ketones, ur 08/10/2018 NEGATIVE  NEGATIVE mg/dL Final  . Protein, ur 08/10/2018 NEGATIVE  NEGATIVE mg/dL Final  . Nitrite 08/10/2018 NEGATIVE  NEGATIVE Final  . Leukocytes, UA 08/10/2018 NEGATIVE  NEGATIVE Final  . RBC / HPF 08/10/2018 6-10  0 - 5 RBC/hpf Final  . WBC, UA 08/10/2018 0-5  0 - 5 WBC/hpf Final  . Bacteria, UA 08/10/2018 RARE* NONE SEEN Final  . Squamous Epithelial / LPF 08/10/2018 0-5  0 - 5 Final  . Mucus 08/10/2018 PRESENT   Final   Performed at [facility], [place] [address], [place], [place] [address]  . aPTT 08/10/2018 32  24 - 36 seconds Final   Performed at [facility], [place] [address] [place], [place] [address]  . Sodium 08/10/2018 141  135 - 145 mmol/L Final  . Potassium 08/10/2018 4.2   3.5 - 5.1 mmol/L Final  . Chloride 08/10/2018 105  98 - 111 mmol/L Final  . CO2 08/10/2018 29  22 - 32 mmol/L Final  . Glucose, Bld 08/10/2018 107* 70 - 99 mg/dL Final  . BUN 08/10/2018 21  8 - 23 mg/dL Final  . Creatinine, Ser 08/10/2018 0.79  0.44 - 1.00 mg/dL Final  . Calcium 08/10/2018 9.3  8.9 - 10.3 mg/dL Final  . GFR calc non Af Amer 08/10/2018 >60  >60 mL/min Final  . GFR calc Af Amer 08/10/2018 >60  >60 mL/min Final  . Anion gap 08/10/2018 7  5 - 15 Final   Performed at [facility], [place] [address], [place], [place] [address]  . WBC 08/10/2018 7.0  4.0 - 10.5 K/uL Final  . RBC 08/10/2018 4.89  3.87 - 5.11 MIL/uL Final  . Hemoglobin 08/10/2018 14.2  12.0 - 15.0 g/dL Final  . HCT 08/10/2018 45.1  36.0 - 46.0 % Final  . MCV 08/10/2018 92.2  80.0 - 100.0 fL Final  . MCH 08/10/2018 29.0  26.0 - 34.0 pg Final  . MCHC 08/10/2018 31.5  30.0 - 36.0 g/dL Final  . RDW 08/10/2018 14.5  11.5 - 15.5 % Final  . Platelets 08/10/2018 359  150 - 400 K/uL Final  . nRBC 08/10/2018 0.0  0.0 - 0.2 % Final   Performed at [facility], [place] [address], [place], [place] [address]  . Prothrombin Time 08/10/2018 12.7  11.4 - 15.2 seconds Final  . INR 08/10/2018 0.97   Final   Performed at [place] [address] [place], [place] [address]  . MRSA, PCR 08/10/2018 NEGATIVE  NEGATIVE Final  . Staphylococcus aureus 08/10/2018 NEGATIVE  NEGATIVE Final   Comment: (NOTE) The Xpert SA Assay (FDA approved for NASAL specimens in patients 13 years of age and older), is one component of a comprehensive surveillance program. It is not intended to diagnose infection nor to guide or monitor treatment. Performed at [facility], [place] [address] [place], [place] [address]      X-Rays:Dg Knee Right Port  Result Date: 08/15/2018 CLINICAL DATA:  Post knee replacement EXAM: PORTABLE RIGHT KNEE - 1-2 VIEW COMPARISON:  None. FINDINGS:  Changes of right knee replacement. No hardware bony complicating feature. Soft tissue and joint space gas noted. IMPRESSION: Right knee replacement.  No complicating feature. Electronically Signed   By: Rolm Baptise M.D.   On: 08/15/2018 11:54    EKG:No orders found for this or  any previous visit.   Hospital Course: JOSUE KASS is a 83 y.o. who was admitted to [facility]. They were brought to the operating room on 08/15/2018 and underwent Procedure(s): TOTAL KNEE ARTHROPLASTY.  Patient tolerated the procedure well and was later transferred to the recovery room and then to the orthopaedic floor for postoperative care.  They were given PO and IV analgesics for pain control following their surgery.  They were given 24 hours of postoperative antibiotics of  Anti-infectives (From admission, onward)   Start     Dose/Rate Route Frequency Ordered Stop   08/15/18 1430  ceFAZolin (ANCEF) IVPB 2g/100 mL premix     2 g 200 mL/hr over 30 Minutes Intravenous Every 6 hours 08/15/18 1232 08/15/18 2200   08/15/18 0825  polymyxin B 500,000 Units, bacitracin 50,000 Units in sodium chloride 0.9 % 500 mL irrigation  Status:  Discontinued       As needed 08/15/18 0825 08/15/18 1042   08/15/18 0645  ceFAZolin (ANCEF) IVPB 2g/100 mL premix     2 g 200 mL/hr over 30 Minutes Intravenous On call to O.R. 08/15/18 0636 08/15/18 0901   08/15/18 0174  ceFAZolin (ANCEF) 2-4 GM/100ML-% IVPB    Note to Pharmacy:  Randa Evens  : cabinet override      08/15/18 0642 08/15/18 0831     and started on DVT prophylaxis in the form of Aspirin, TED hose and SCDs.   PT and OT were ordered for total joint protocol.  Discharge planning consulted to help with postop disposition and equipment needs.  Patient had a fair night on the evening of surgery.  They started to get up OOB with therapy on day one.  Continued to work with therapy into day two. She had continued nausea and pain that did slow her progress post-op. By  day three, the patient had progressed with therapy and meeting their goals.  Incision was healing well.  Patient was seen in rounds and was ready to go home.   Diet: Regular diet Activity:WBAT Follow-up:in 14 days Disposition - Home with HHPT Discharged Condition: good    Allergies as of 08/17/2018   No Known Allergies     Medication List    TAKE these medications   aspirin 81 MG chewable tablet Chew 1 tablet (81 mg total) by mouth 2 (two) times daily.   docusate sodium 100 MG capsule Commonly known as:  COLACE Take 1 capsule (100 mg total) by mouth 2 (two) times daily.   hydroxypropyl methylcellulose / hypromellose 2.5 % ophthalmic solution Commonly known as:  ISOPTO TEARS / GONIOVISC Place 1 drop into both eyes daily as needed for dry eyes.   ibuprofen 200 MG tablet Commonly known as:  ADVIL,MOTRIN Take 400 mg by mouth every 6 (six) hours as needed for moderate pain.   multivitamin with minerals Tabs tablet Take 1 tablet by mouth daily.   ondansetron 4 MG tablet Commonly known as:  ZOFRAN Take 1 tablet (4 mg total) by mouth every 6 (six) hours as needed for nausea.   oxyCODONE 5 MG immediate release tablet Commonly known as:  Oxy IR/ROXICODONE Take 1-2 tablets (5-10 mg total) by mouth every 4 (four) hours as needed for moderate pain (pain score 4-6).   polyethylene glycol packet Commonly known as:  MIRALAX / GLYCOLAX Take 17 g by mouth daily. Start taking on:  August 18, 2018   Vitamin D-3 25 MCG (1000 UT) Caps Take 1,000 Units by mouth daily.  Durable Medical Equipment  (From admission, onward)         Start     Ordered   08/16/18 1039  For home use only DME Walker rolling  Once    Question:  Patient needs a walker to treat with the following condition  Answer:  Surgery, elective   08/16/18 1039         Follow-up Information    Susa Day, MD Follow up in 2 week(s).   Specialty:  Orthopedic Surgery Contact information: [address] [place] [address] [place] [address] [phone]        [facility] - Dme Follow up.   Why:  home rolling walker Contact information: [place] [address] [phone]           Signed: Lacie Draft, PA-C Orthopaedic Surgery 08/17/2018, 10:15 AM

## 2018-08-17 NOTE — Progress Notes (Signed)
Physical Therapy Treatment Patient Details Name: [name] MRN: [id number] DOB: 1936/01/27 Today's Date: 08/17/2018    History of Present Illness 83 yo female s/p R TKR on 08/15/18. PMH includes OA, measles, mumps, R knee arthroscopy, RTC repair.     PT Comments    Continues to progress slowly d/t pain, nausea and dizzness; will see again later today, not ready for d/c   Follow Up Recommendations  Follow surgeon's recommendation for DC plan and follow-up therapies;Supervision for mobility/OOB     Equipment Recommendations  Rolling walker with 5" wheels    Recommendations for Other Services       Precautions / Restrictions Precautions Precautions: Fall;Knee Restrictions Weight Bearing Restrictions: No Other Position/Activity Restrictions: WBAT     Mobility  Bed Mobility Overal bed mobility: Needs Assistance Bed Mobility: Supine to Sit     Supine to sit: Supervision;HOB elevated     General bed mobility comments: incr time, cues to self assist with gait belt as leg lifter  Transfers Overall transfer level: Needs assistance Equipment used: Rolling walker (2 wheeled) Transfers: Sit to/from Stand Sit to Stand: Min guard         General transfer comment: cues for hand placmeent and RLE management  Ambulation/Gait Ambulation/Gait assistance: Min guard;Min assist Gait Distance (Feet): 12 Feet Assistive device: Rolling walker (2 wheeled) Gait Pattern/deviations: Step-to pattern;Decreased stance time - right;Decreased weight shift to right     General Gait Details: cues for sequence, breathing, distance limited by nausea   Stairs             Wheelchair Mobility    Modified Rankin (Stroke Patients Only)       Balance Overall balance assessment: Needs assistance           Standing balance-Leahy Scale: Poor Standing balance comment: reliant on UEs                            Cognition Arousal/Alertness: Awake/alert Behavior  During Therapy: WFL for tasks assessed/performed Overall Cognitive Status: Within Functional Limits for tasks assessed                                        Exercises Total Joint Exercises Ankle Circles/Pumps: AROM;Both;10 reps    General Comments        Pertinent Vitals/Pain Pain Assessment: 0-10 Pain Score: 5  Pain Location: R knee  Pain Descriptors / Indicators: Grimacing;Guarding;Discomfort Pain Intervention(s): Limited activity within patient's tolerance;Monitored during session;Premedicated before session;Repositioned    Home Living                      Prior Function            PT Goals (current goals can now be found in the care plan section) Acute Rehab PT Goals Patient Stated Goal: decrease R knee pain  PT Goal Formulation: With patient Time For Goal Achievement: 08/22/18 Potential to Achieve Goals: Good Progress towards PT goals: Not progressing toward goals - comment(nausea and dizziness)    Frequency    7X/week      PT Plan Current plan remains appropriate    Co-evaluation              AM-PAC PT "6 Clicks" Mobility   Outcome Measure  Help needed turning from your back to your side while in a flat  bed without using bedrails?: A Little Help needed moving from lying on your back to sitting on the side of a flat bed without using bedrails?: A Little Help needed moving to and from a bed to a chair (including a wheelchair)?: A Little Help needed standing up from a chair using your arms (e.g., wheelchair or bedside chair)?: A Little Help needed to walk in hospital room?: A Little Help needed climbing 3-5 steps with a railing? : A Lot 6 Click Score: 17    End of Session Equipment Utilized During Treatment: Gait belt Activity Tolerance: Patient limited by pain;Other (comment)(nausea adn dizziness) Patient left: in chair;with call bell/phone within reach;with family/visitor present;with chair alarm set Nurse  Communication: Mobility status PT Visit Diagnosis: Other abnormalities of gait and mobility (R26.89);Difficulty in walking, not elsewhere classified (R26.2)     Time: 4193-7902 PT Time Calculation (min) (ACUTE ONLY): 16 min  Charges:  $Gait Training: 8-22 mins                     Kenyon Ana, PT  Pager: [phone] Acute Rehab Dept [facility]): [phone]   08/17/2018    [facility] 08/17/2018, 10:43 AM

## 2018-08-18 DIAGNOSIS — M1711 Unilateral primary osteoarthritis, right knee: Secondary | ICD-10-CM | POA: Diagnosis not present

## 2018-08-18 LAB — CBC
HCT: 36 % (ref 36.0–46.0)
Hemoglobin: 11.2 g/dL — ABNORMAL LOW (ref 12.0–15.0)
MCH: 29.1 pg (ref 26.0–34.0)
MCHC: 31.1 g/dL (ref 30.0–36.0)
MCV: 93.5 fL (ref 80.0–100.0)
Platelets: 256 10*3/uL (ref 150–400)
RBC: 3.85 MIL/uL — ABNORMAL LOW (ref 3.87–5.11)
RDW: 14.7 % (ref 11.5–15.5)
WBC: 8.6 10*3/uL (ref 4.0–10.5)
nRBC: 0 % (ref 0.0–0.2)

## 2018-08-18 NOTE — Progress Notes (Signed)
Pt was provided with d/c instructions. Upon discussing the pt's plan of care upon d/c home, the pt reported no further questions or concerns.

## 2018-08-18 NOTE — Progress Notes (Signed)
Subjective: 3 Days Post-Op Procedure(s) (LRB): TOTAL KNEE ARTHROPLASTY (Right) Patient reports pain as well controlled.  Reports a good night. Progress with PT. Denies SOB or CP.  Objective: Vital signs in last 24 hours: Temp:  [98.9 F (37.2 C)-99.1 F (37.3 C)] 99.1 F (37.3 C) (01/18 0448) Pulse Rate:  [80-94] 94 (01/18 0448) Resp:  [16] 16 (01/18 0448) BP: (127-157)/(61-66) 157/65 (01/18 0448) SpO2:  [93 %-97 %] 97 % (01/18 0448)  Intake/Output from previous day: 01/17 0701 - 01/18 0700 In: 720 [P.O.:720] Out: -  Intake/Output this shift: No intake/output data recorded.  Recent Labs    08/16/18 0411 08/17/18 0619 08/18/18 0354  HGB 12.2 11.3* 11.2*   Recent Labs    08/17/18 0619 08/18/18 0354  WBC 11.7* 8.6  RBC 3.77* 3.85*  HCT 35.7* 36.0  PLT 253 256   Recent Labs    08/16/18 0411  NA 135  K 4.5  CL 103  CO2 25  BUN 17  CREATININE 0.73  GLUCOSE 148*  CALCIUM 8.3*   No results for input(s): LABPT, INR in the last 72 hours.  Well nourished. Alert and oriented x3. RRR, Lungs clear, BS x4. Abdomen soft and non tender. Right Calf soft and non tender. Right knee dressing C/D/I. No DVT signs. Compartment soft. No signs of infection.  Right LE grossly neurovascular intact.  Anticipated LOS equal to or greater than 2 midnights due to - Age 30 and older with one or more of the following:  - Obesity  - Expected need for hospital services (PT, OT, Nursing) required for safe  discharge  - Anticipated need for postoperative skilled nursing care or inpatient rehab  - Active co-morbidities: None OR   - Unanticipated findings during/Post Surgery: Slow post-op progression: GI, pain control, mobility  - Patient is a high risk of re-admission due to: None   Assessment/Plan: 3 Days Post-Op Procedure(s) (LRB): TOTAL KNEE ARTHROPLASTY (Right) Advance diet Up with therapy D/C IV fluids Discharge home with home health  Rx sent Electronically ASA    [name] L  [name] 08/18/2018, 8:08 AM

## 2018-08-18 NOTE — Progress Notes (Signed)
Physical Therapy Treatment Patient Details Name: FRUMA AFRICA MRN: [id number] DOB: 10/04/1935 Today's Date: 08/18/2018    History of Present Illness 83 yo female s/p R TKR on 08/15/18. PMH includes OA, measles, mumps, R knee arthroscopy, RTC repair.     PT Comments    Progressing well with mobility. Pt denies nausea/dizziness on today. Reviewed/practiced gait training, stair training, and exercises. All education completed-made RN aware.    Follow Up Recommendations  Follow surgeon's recommendation for DC plan and follow-up therapies     Equipment Recommendations  Rolling walker with 5" wheels    Recommendations for Other Services       Precautions / Restrictions Precautions Precautions: Fall;Knee Required Braces or Orthoses: Knee Immobilizer - Right Knee Immobilizer - Right: Discontinue once straight leg raise with < 10 degree lag Restrictions Weight Bearing Restrictions: No Other Position/Activity Restrictions: WBAT     Mobility  Bed Mobility               General bed mobility comments: oob in recliner  Transfers Overall transfer level: Needs assistance Equipment used: Rolling walker (2 wheeled) Transfers: Sit to/from Stand Sit to Stand: Min guard         General transfer comment: Close guard for safety. VC safety, hand/LE placement   Ambulation/Gait Ambulation/Gait assistance: Min guard Gait Distance (Feet): 50 Feet Assistive device: Rolling walker (2 wheeled) Gait Pattern/deviations: Step-to pattern     General Gait Details: VCs safety, sequence. Close guard for safety. Slow gait speed. Pt denied dizziness, nausea   Stairs Stairs: Yes Min Assist   Number of Stairs: 2 General stair comments: up and over portable steps with 1 HHA. VCs safety, technqiue, sequence. Husband present to observe.    Wheelchair Mobility    Modified Rankin (Stroke Patients Only)       Balance Overall balance assessment: Needs assistance          Standing balance support: Bilateral upper extremity supported Standing balance-Leahy Scale: Poor                              Cognition Arousal/Alertness: Awake/alert Behavior During Therapy: WFL for tasks assessed/performed Overall Cognitive Status: Within Functional Limits for tasks assessed                                        Exercises Total Joint Exercises Ankle Circles/Pumps: AROM;Both;10 reps;Seated Quad Sets: AROM;Both;10 reps;Seated Heel Slides: AAROM;Right;10 reps;AROM;Supine Hip ABduction/ADduction: AAROM;Right;10 reps;Seated Straight Leg Raises: AROM;AAROM;Right;10 reps;Supine Goniometric ROM: ~10-60 degrees    General Comments        Pertinent Vitals/Pain Pain Assessment: 0-10 Pain Score: 5  Pain Location: R knee  Pain Descriptors / Indicators: Discomfort;Sore Pain Intervention(s): Monitored during session;Repositioned;Ice applied    Home Living                      Prior Function            PT Goals (current goals can now be found in the care plan section) Progress towards PT goals: Progressing toward goals    Frequency    7X/week      PT Plan Current plan remains appropriate    Co-evaluation              AM-PAC PT "6 Clicks" Mobility   Outcome Measure  Help needed turning  from your back to your side while in a flat bed without using bedrails?: A Little Help needed moving from lying on your back to sitting on the side of a flat bed without using bedrails?: A Little Help needed moving to and from a bed to a chair (including a wheelchair)?: A Little Help needed standing up from a chair using your arms (e.g., wheelchair or bedside chair)?: A Little Help needed to walk in hospital room?: A Little Help needed climbing 3-5 steps with a railing? : A Little 6 Click Score: 18    End of Session Equipment Utilized During Treatment: Gait belt Activity Tolerance: Patient tolerated treatment well Patient  left: in chair;with call bell/phone within reach;with family/visitor present   PT Visit Diagnosis: Other abnormalities of gait and mobility (R26.89);Difficulty in walking, not elsewhere classified (R26.2)     Time: 1021-1040 PT Time Calculation (min) (ACUTE ONLY): 19 min  Charges:  $Gait Training: 8-22 mins                       Weston Anna, PT Acute Rehabilitation Services Pager: [phone] Office: [phone]

## 2019-06-11 ENCOUNTER — Other Ambulatory Visit: Payer: Self-pay | Admitting: Family Medicine

## 2019-06-11 DIAGNOSIS — R6889 Other general symptoms and signs: Secondary | ICD-10-CM

## 2019-06-17 ENCOUNTER — Ambulatory Visit
Admission: RE | Admit: 2019-06-17 | Discharge: 2019-06-17 | Disposition: A | Discharge status: Home / Self Care | Payer: Medicare Other | Source: Ambulatory Visit | Attending: Family Medicine | Admitting: Family Medicine

## 2019-06-17 DIAGNOSIS — R6889 Other general symptoms and signs: Secondary | ICD-10-CM

## 2020-03-24 ENCOUNTER — Other Ambulatory Visit (INDEPENDENT_AMBULATORY_CARE_PROVIDER_SITE_OTHER): Payer: Self-pay | Admitting: Otolaryngology

## 2020-08-25 ENCOUNTER — Other Ambulatory Visit: Payer: Self-pay | Admitting: Family Medicine

## 2020-08-25 DIAGNOSIS — Z1231 Encounter for screening mammogram for malignant neoplasm of breast: Secondary | ICD-10-CM

## 2020-08-25 DIAGNOSIS — E559 Vitamin D deficiency, unspecified: Secondary | ICD-10-CM | POA: Diagnosis not present

## 2020-08-25 DIAGNOSIS — Z806 Family history of leukemia: Secondary | ICD-10-CM | POA: Diagnosis not present

## 2020-08-25 DIAGNOSIS — Z0000 Encounter for general adult medical examination without abnormal findings: Secondary | ICD-10-CM | POA: Diagnosis not present

## 2020-08-25 DIAGNOSIS — N3090 Cystitis, unspecified without hematuria: Secondary | ICD-10-CM | POA: Diagnosis not present

## 2020-08-25 DIAGNOSIS — Z1389 Encounter for screening for other disorder: Secondary | ICD-10-CM | POA: Diagnosis not present

## 2020-08-25 DIAGNOSIS — M8588 Other specified disorders of bone density and structure, other site: Secondary | ICD-10-CM | POA: Diagnosis not present

## 2020-08-25 DIAGNOSIS — M8580 Other specified disorders of bone density and structure, unspecified site: Secondary | ICD-10-CM

## 2020-08-25 DIAGNOSIS — D369 Benign neoplasm, unspecified site: Secondary | ICD-10-CM | POA: Diagnosis not present

## 2020-09-24 DIAGNOSIS — D369 Benign neoplasm, unspecified site: Secondary | ICD-10-CM | POA: Diagnosis not present

## 2020-12-29 ENCOUNTER — Ambulatory Visit
Admission: RE | Admit: 2020-12-29 | Discharge: 2020-12-29 | Disposition: A | Discharge status: Home / Self Care | Payer: Medicare Other | Source: Ambulatory Visit | Attending: Family Medicine | Admitting: Family Medicine

## 2020-12-29 ENCOUNTER — Other Ambulatory Visit: Payer: Self-pay

## 2020-12-29 DIAGNOSIS — Z1231 Encounter for screening mammogram for malignant neoplasm of breast: Secondary | ICD-10-CM

## 2020-12-29 DIAGNOSIS — M85851 Other specified disorders of bone density and structure, right thigh: Secondary | ICD-10-CM | POA: Diagnosis not present

## 2020-12-29 DIAGNOSIS — M8580 Other specified disorders of bone density and structure, unspecified site: Secondary | ICD-10-CM

## 2020-12-29 DIAGNOSIS — Z780 Asymptomatic menopausal state: Secondary | ICD-10-CM | POA: Diagnosis not present

## 2021-07-10 DIAGNOSIS — J209 Acute bronchitis, unspecified: Secondary | ICD-10-CM | POA: Diagnosis not present

## 2021-07-10 DIAGNOSIS — M791 Myalgia, unspecified site: Secondary | ICD-10-CM | POA: Diagnosis not present

## 2021-07-10 DIAGNOSIS — R070 Pain in throat: Secondary | ICD-10-CM | POA: Diagnosis not present

## 2021-07-10 DIAGNOSIS — J0100 Acute maxillary sinusitis, unspecified: Secondary | ICD-10-CM | POA: Diagnosis not present

## 2021-10-02 DIAGNOSIS — J029 Acute pharyngitis, unspecified: Secondary | ICD-10-CM | POA: Diagnosis not present

## 2021-10-02 DIAGNOSIS — J0100 Acute maxillary sinusitis, unspecified: Secondary | ICD-10-CM | POA: Diagnosis not present

## 2021-12-20 DIAGNOSIS — M25571 Pain in right ankle and joints of right foot: Secondary | ICD-10-CM | POA: Diagnosis not present

## 2021-12-20 DIAGNOSIS — M19071 Primary osteoarthritis, right ankle and foot: Secondary | ICD-10-CM | POA: Diagnosis not present

## 2022-01-11 DIAGNOSIS — S93491A Sprain of other ligament of right ankle, initial encounter: Secondary | ICD-10-CM | POA: Diagnosis not present

## 2022-05-26 DIAGNOSIS — N3090 Cystitis, unspecified without hematuria: Secondary | ICD-10-CM | POA: Diagnosis not present

## 2022-05-26 DIAGNOSIS — N3011 Interstitial cystitis (chronic) with hematuria: Secondary | ICD-10-CM | POA: Diagnosis not present

## 2022-06-30 DIAGNOSIS — Z0000 Encounter for general adult medical examination without abnormal findings: Secondary | ICD-10-CM | POA: Diagnosis not present

## 2022-06-30 DIAGNOSIS — M129 Arthropathy, unspecified: Secondary | ICD-10-CM | POA: Diagnosis not present

## 2022-06-30 DIAGNOSIS — M150 Primary generalized (osteo)arthritis: Secondary | ICD-10-CM | POA: Diagnosis not present

## 2022-06-30 DIAGNOSIS — E559 Vitamin D deficiency, unspecified: Secondary | ICD-10-CM | POA: Diagnosis not present

## 2022-06-30 DIAGNOSIS — Z1159 Encounter for screening for other viral diseases: Secondary | ICD-10-CM | POA: Diagnosis not present

## 2022-06-30 DIAGNOSIS — M8588 Other specified disorders of bone density and structure, other site: Secondary | ICD-10-CM | POA: Diagnosis not present

## 2022-06-30 DIAGNOSIS — Z23 Encounter for immunization: Secondary | ICD-10-CM | POA: Diagnosis not present

## 2022-07-06 DIAGNOSIS — H0102A Squamous blepharitis right eye, upper and lower eyelids: Secondary | ICD-10-CM | POA: Diagnosis not present

## 2022-07-06 DIAGNOSIS — H02831 Dermatochalasis of right upper eyelid: Secondary | ICD-10-CM | POA: Diagnosis not present

## 2022-07-06 DIAGNOSIS — H43813 Vitreous degeneration, bilateral: Secondary | ICD-10-CM | POA: Diagnosis not present

## 2022-07-06 DIAGNOSIS — H02834 Dermatochalasis of left upper eyelid: Secondary | ICD-10-CM | POA: Diagnosis not present

## 2022-07-06 DIAGNOSIS — Z961 Presence of intraocular lens: Secondary | ICD-10-CM | POA: Diagnosis not present

## 2022-07-06 DIAGNOSIS — H0102B Squamous blepharitis left eye, upper and lower eyelids: Secondary | ICD-10-CM | POA: Diagnosis not present

## 2022-07-08 DIAGNOSIS — S93491A Sprain of other ligament of right ankle, initial encounter: Secondary | ICD-10-CM | POA: Diagnosis not present

## 2022-07-26 ENCOUNTER — Encounter (HOSPITAL_COMMUNITY): Payer: Self-pay | Admitting: Emergency Medicine

## 2022-07-26 ENCOUNTER — Emergency Department (HOSPITAL_COMMUNITY): Payer: Medicare Other

## 2022-07-26 ENCOUNTER — Emergency Department (HOSPITAL_COMMUNITY)
Admission: EM | Admit: 2022-07-26 | Discharge: 2022-07-26 | Disposition: A | Discharge status: Home / Self Care | Payer: Medicare Other | Attending: Emergency Medicine | Admitting: Emergency Medicine

## 2022-07-26 DIAGNOSIS — N289 Disorder of kidney and ureter, unspecified: Secondary | ICD-10-CM | POA: Diagnosis not present

## 2022-07-26 DIAGNOSIS — K7689 Other specified diseases of liver: Secondary | ICD-10-CM | POA: Diagnosis not present

## 2022-07-26 DIAGNOSIS — Z7982 Long term (current) use of aspirin: Secondary | ICD-10-CM | POA: Diagnosis not present

## 2022-07-26 DIAGNOSIS — R1900 Intra-abdominal and pelvic swelling, mass and lump, unspecified site: Secondary | ICD-10-CM

## 2022-07-26 DIAGNOSIS — R1904 Left lower quadrant abdominal swelling, mass and lump: Secondary | ICD-10-CM | POA: Insufficient documentation

## 2022-07-26 DIAGNOSIS — N390 Urinary tract infection, site not specified: Secondary | ICD-10-CM | POA: Diagnosis not present

## 2022-07-26 DIAGNOSIS — R1032 Left lower quadrant pain: Secondary | ICD-10-CM

## 2022-07-26 LAB — COMPREHENSIVE METABOLIC PANEL
ALT: 17 U/L (ref 0–44)
AST: 24 U/L (ref 15–41)
Albumin: 4 g/dL (ref 3.5–5.0)
Alkaline Phosphatase: 58 U/L (ref 38–126)
Anion gap: 8 (ref 5–15)
BUN: 12 mg/dL (ref 8–23)
CO2: 26 mmol/L (ref 22–32)
Calcium: 9.2 mg/dL (ref 8.9–10.3)
Chloride: 105 mmol/L (ref 98–111)
Creatinine, Ser: 0.97 mg/dL (ref 0.44–1.00)
GFR, Estimated: 58 mL/min — ABNORMAL LOW (ref >60)
Glucose, Bld: 114 mg/dL — ABNORMAL HIGH (ref 70–99)
Potassium: 4.4 mmol/L (ref 3.5–5.1)
Sodium: 139 mmol/L (ref 135–145)
Total Bilirubin: 0.6 mg/dL (ref 0.3–1.2)
Total Protein: 7.1 g/dL (ref 6.5–8.1)

## 2022-07-26 LAB — URINALYSIS, ROUTINE W REFLEX MICROSCOPIC
Bilirubin Urine: NEGATIVE
Glucose, UA: NEGATIVE mg/dL
Ketones, ur: NEGATIVE mg/dL
Nitrite: NEGATIVE
Protein, ur: NEGATIVE mg/dL
Specific Gravity, Urine: 1.006 (ref 1.005–1.030)
pH: 7 (ref 5.0–8.0)

## 2022-07-26 LAB — CBC
HCT: 46 % (ref 36.0–46.0)
Hemoglobin: 15.6 g/dL — ABNORMAL HIGH (ref 12.0–15.0)
MCH: 30.2 pg (ref 26.0–34.0)
MCHC: 33.9 g/dL (ref 30.0–36.0)
MCV: 89 fL (ref 80.0–100.0)
Platelets: 311 10*3/uL (ref 150–400)
RBC: 5.17 MIL/uL — ABNORMAL HIGH (ref 3.87–5.11)
RDW: 14.3 % (ref 11.5–15.5)
WBC: 8.7 10*3/uL (ref 4.0–10.5)
nRBC: 0 % (ref 0.0–0.2)

## 2022-07-26 LAB — LIPASE, BLOOD: Lipase: 28 U/L (ref 11–51)

## 2022-07-26 MED ORDER — CEPHALEXIN 500 MG PO CAPS: 500.0000 mg | ORAL_CAPSULE | Freq: Four times a day (QID) | ORAL | 0 refills | Status: AC

## 2022-07-26 MED ORDER — IOHEXOL 350 MG/ML SOLN
75.0000 mL | Freq: Once | INTRAVENOUS | Status: AC | PRN
  Administered 2022-07-26: 75 mL via INTRAVENOUS

## 2022-07-26 MED ORDER — ACETAMINOPHEN 325 MG PO TABS
650.0000 mg | ORAL_TABLET | Freq: Once | ORAL | Status: AC
  Administered 2022-07-26: 650 mg via ORAL
  Filled 2022-07-26: qty 2

## 2022-07-26 MED ORDER — SODIUM CHLORIDE 0.9 % IV SOLN
1.0000 g | Freq: Once | INTRAVENOUS | Status: AC
  Administered 2022-07-26: 1 g via INTRAVENOUS
  Filled 2022-07-26: qty 10

## 2022-07-26 NOTE — Discharge Instructions (Addendum)
Your workup today showed that you likely have a UTI.  You received a dose of antibiotic in the emergency department. Additional antibiotics into the pharmacy for you.  For any concerning or worsening symptoms return to the emergency room.  He also had multiple findings on the CT scan that were abnormal.  It showed a left ovarian mass, cyst surrounding the pancreas, likely uterus fibroid, and a kidneys cyst.  These can be followed up outpatient by your primary care provider.  I have also given you a referral to gynecologist, urologist to follow-up on the renal cyst, the pelvic mass.  There is also a cyst surrounding the pancreas.  This can be followed up by the primary care provider.  You will likely require an MRI of the abdomen and pelvis.

## 2022-07-26 NOTE — ED Triage Notes (Signed)
Pt reports LLQ abd pain since yesterday, throbbing pain. Denies any n/v/d.

## 2022-07-26 NOTE — ED Provider Notes (Signed)
[facility] EMERGENCY DEPARTMENT Provider Note   CSN: [id number] Arrival date & time: 07/26/22  0701     History  Chief Complaint  Patient presents with   Abdominal Pain    JENNIAH BHAVSAR is a 86 y.o. female.  86 year old female presents today for evaluation of left lower quadrant abdominal pain that started yesterday.  She denies associated fever, chills, nausea, vomiting, dysuria.  However she endorses urinary frequency since this morning.  Pain radiates to left flank.  Denies hematuria, history of kidney stone.  The history is provided by the patient. No language interpreter was used.       Home Medications Prior to Admission medications   Medication Sig Start Date End Date Taking? Authorizing Provider  cephALEXin (KEFLEX) 500 MG capsule Take 1 capsule (500 mg total) by mouth 4 (four) times daily for 7 days. 07/26/22 08/02/22 Yes Deatra Canter, Lura Falor, PA-C  aspirin 81 MG chewable tablet Chew 1 tablet (81 mg total) by mouth 2 (two) times daily. 08/17/18   Cecilie Kicks, PA-C  Cholecalciferol (VITAMIN D-3) 25 MCG (1000 UT) CAPS Take 1,000 Units by mouth daily.    [provider]  docusate sodium (COLACE) 100 MG capsule Take 1 capsule (100 mg total) by mouth 2 (two) times daily. 08/17/18   Cecilie Kicks, PA-C  fluticasone (FLONASE) 50 MCG/ACT nasal spray USE 2 SPRAY(S) IN [facility] NOSTRIL ONCE DAILY AT NIGHT 05/26/20   Rozetta Nunnery, MD  hydroxypropyl methylcellulose / hypromellose (ISOPTO TEARS / GONIOVISC) 2.5 % ophthalmic solution Place 1 drop into both eyes daily as needed for dry eyes.    [provider]  ibuprofen (ADVIL,MOTRIN) 200 MG tablet Take 400 mg by mouth every 6 (six) hours as needed for moderate pain.    [provider]  Multiple Vitamin (MULTIVITAMIN WITH MINERALS) TABS tablet Take 1 tablet by mouth daily.    [provider]  ondansetron (ZOFRAN) 4 MG tablet Take 1 tablet (4 mg total) by mouth every 6 (six)  hours as needed for nausea. 08/17/18   Cecilie Kicks, PA-C  oxyCODONE (OXY IR/ROXICODONE) 5 MG immediate release tablet Take 1-2 tablets (5-10 mg total) by mouth every 4 (four) hours as needed for moderate pain (pain score 4-6). 08/17/18   Cecilie Kicks, PA-C  polyethylene glycol (MIRALAX / GLYCOLAX) packet Take 17 g by mouth daily. 08/18/18   Cecilie Kicks, PA-C      Allergies    Patient has no known allergies.    Review of Systems   Review of Systems  Constitutional:  Negative for chills and fever.  Gastrointestinal:  Positive for abdominal pain. Negative for nausea and vomiting.  Genitourinary:  Positive for flank pain and frequency. Negative for dysuria.  All other systems reviewed and are negative.   Physical Exam Updated Vital Signs BP 132/67   Pulse 74   Temp 97.8 F (36.6 C)   Resp 16   Ht '5\' 4"'$  (1.626 m)   Wt 104 kg   SpO2 100%   BMI 39.36 kg/m  Physical Exam Vitals and nursing note reviewed.  Constitutional:      General: She is not in acute distress.    Appearance: Normal appearance. She is not ill-appearing.  HENT:     Head: Normocephalic and atraumatic.     Nose: Nose normal.  Eyes:     General: No scleral icterus.    Extraocular Movements: Extraocular movements intact.     Conjunctiva/sclera: Conjunctivae normal.  Cardiovascular:     Rate and Rhythm: Normal rate and regular rhythm.     Pulses: Normal pulses.  Pulmonary:     Effort: Pulmonary effort is normal. No respiratory distress.     Breath sounds: Normal breath sounds. No wheezing or rales.  Abdominal:     General: There is no distension.     Tenderness: There is abdominal tenderness. There is left CVA tenderness. There is no right CVA tenderness, guarding or rebound.  Musculoskeletal:        General: Normal range of motion.     Cervical back: Normal range of motion.  Skin:    General: Skin is warm and dry.  Neurological:     General: No focal deficit present.     Mental Status: She  is alert. Mental status is at baseline.     ED Results / Procedures / Treatments   Labs (all labs ordered are listed, but only abnormal results are displayed) Labs Reviewed  COMPREHENSIVE METABOLIC PANEL - Abnormal; Notable for the following components:      Result Value   Glucose, Bld 114 (*)    GFR, Estimated 58 (*)    All other components within normal limits  CBC - Abnormal; Notable for the following components:   RBC 5.17 (*)    Hemoglobin 15.6 (*)    All other components within normal limits  URINALYSIS, ROUTINE W REFLEX MICROSCOPIC - Abnormal; Notable for the following components:   APPearance HAZY (*)    Hgb urine dipstick MODERATE (*)    Leukocytes,Ua MODERATE (*)    Bacteria, UA MANY (*)    Non Squamous Epithelial 0-5 (*)    All other components within normal limits  URINE CULTURE  LIPASE, BLOOD    EKG None  Radiology CT ABDOMEN PELVIS W CONTRAST  Result Date: 07/26/2022 CLINICAL DATA:  Left lower quadrant pain beginning yesterday. EXAM: CT ABDOMEN AND PELVIS WITH CONTRAST TECHNIQUE: Multidetector CT imaging of the abdomen and pelvis was performed using the standard protocol following bolus administration of intravenous contrast. RADIATION DOSE REDUCTION: This exam was performed according to the departmental dose-optimization program which includes automated exposure control, adjustment of the mA and/or kV according to patient size and/or use of iterative reconstruction technique. CONTRAST:  70m OMNIPAQUE IOHEXOL 350 MG/ML SOLN COMPARISON:  None Available. FINDINGS: Lower Chest: No acute findings. Hepatobiliary: No hepatic masses identified. Small cyst noted in the left hepatic lobe. Prior cholecystectomy. No evidence of biliary obstruction. Pancreas: A simple appearing cystic lesion is seen in the pancreatic neck which measures 3.0 x 1.6 cm on image 20/3. No evidence of pancreatic ductal dilatation or peripancreatic inflammatory changes. Spleen: Within normal limits in  size and appearance. Adrenals/Urinary Tract: A 1.2 cm lesion is seen in the posterior midpole of the right kidney on image 34/3, which shows possible contrast enhancement. Differential diagnosis includes renal mass and hemorrhagic cyst. No other suspicious renal lesions identified. No evidence of ureteral calculi or hydronephrosis. Stomach/Bowel: No evidence of obstruction, inflammatory process or abnormal fluid collections. Vascular/Lymphatic: No pathologically enlarged lymph nodes. No acute vascular findings. Reproductive: Small partially calcified uterine fibroid is seen in the right uterine corpus measuring 2 cm. A 1.1 cm mass is seen centrally in the uterine fundus along the endometrial cavity, which could represent an endometrial polyp or submucosal fibroid. A lobulated mass is seen in the right adnexa which measures 3.7 by 3.2 cm. No evidence of inflammatory process or abnormal fluid collections. Other:  None. Musculoskeletal:  No  suspicious bone lesions identified. IMPRESSION: No acute findings. 3.7 cm lobulated mass in right adnexa, suspicious for ovarian neoplasm. Recommend further characterization with nonemergent outpatient pelvic MRI without and with contrast. 1.1 cm mass centrally in the uterine fundus, which could represent an endometrial polyp or submucosal fibroid. This could also be better characterized by pelvic MRI. 2 cm right-sided uterine fibroid. 1.2 cm indeterminate lesion in posterior midpole of right kidney, with possible contrast enhancement. Differential diagnosis includes renal mass and hemorrhagic cyst. Recommend nonemergent, outpatient abdomen MRI without and with contrast for further characterization. 3 cm simple appearing cystic lesion in pancreatic neck, suspicious for indolent cystic pancreatic neoplasm. This can also be further characterized by abdomen MRI. Electronically Signed   By: Marlaine Hind M.D.   On: 07/26/2022 08:34    Procedures Procedures    Medications Ordered in  ED Medications  iohexol (OMNIPAQUE) 350 MG/ML injection 75 mL (75 mLs Intravenous Contrast Given 07/26/22 0823)  cefTRIAXone (ROCEPHIN) 1 g in sodium chloride 0.9 % 100 mL IVPB (1 g Intravenous New Bag/Given 07/26/22 1504)  acetaminophen (TYLENOL) tablet 650 mg (650 mg Oral Given 07/26/22 1505)    ED Course/ Medical Decision Making/ A&P                           Medical Decision Making Amount and/or Complexity of Data Reviewed Labs: ordered.  Risk OTC drugs. Prescription drug management.   Medical Decision Making / ED Course   This patient presents to the ED for concern of left lower quadrant abdominal pain, this involves an extensive number of treatment options, and is a complaint that carries with it a high risk of complications and morbidity.  The differential diagnosis includes UTI, pyelonephritis, kidney stone, diverticulitis, colitis,  MDM: 86 year old female presents today for evaluation of above-mentioned complaints.  She is overall well-appearing.  Workup that was initiated through [facility] shows CBC without leukocytosis, hemoglobin of 15.6 otherwise unremarkable.  UA shows leukocytes, many bacteria, 6-10 WBCs, with hazy appearance.  CMP shows glucose 114 otherwise unremarkable.  Lipase within normal limits.  CT ab and pelvis obtained which shows no acute findings such as nephrolithiasis, diverticulitis, or other acute infection or inflammation.  It does show multiple abnormal lesions including left lobulated pelvic mass surrounding the ovary, potentially uterine fibroid, cyst surrounding the pancreas, renal cyst.  Given the urinary frequency and onset of her symptoms and the urine findings believe patient likely has a UTI for the acute presentation.  Discussed the abnormal CT findings with patient and her husband.  Discussed these will need to be followed up outpatient and will likely need to be further evaluated with an MRI.  Provided her with referral to gynecology, urology and  requested that she call her primary care provider today to schedule a PCP follow-up.  She is in agreement with this plan.  Return precaution discussed.  Patient is appropriate for discharge.  Discharged in stable condition. Patient discussed with attending who is in agreement with plan.    Lab Tests: -I ordered, reviewed, and interpreted labs.   The pertinent results include:   Labs Reviewed  COMPREHENSIVE METABOLIC PANEL - Abnormal; Notable for the following components:      Result Value   Glucose, Bld 114 (*)    GFR, Estimated 58 (*)    All other components within normal limits  CBC - Abnormal; Notable for the following components:   RBC 5.17 (*)    Hemoglobin 15.6 (*)  All other components within normal limits  URINALYSIS, ROUTINE W REFLEX MICROSCOPIC - Abnormal; Notable for the following components:   APPearance HAZY (*)    Hgb urine dipstick MODERATE (*)    Leukocytes,Ua MODERATE (*)    Bacteria, UA MANY (*)    Non Squamous Epithelial 0-5 (*)    All other components within normal limits  URINE CULTURE  LIPASE, BLOOD      EKG  EKG Interpretation  Date/Time:    Ventricular Rate:    PR Interval:    QRS Duration:   QT Interval:    QTC Calculation:   R Axis:     Text Interpretation:           Imaging Studies ordered: I ordered imaging studies including CT abdomen pelvis with contrast I independently visualized and interpreted imaging. I agree with the radiologist interpretation   Medicines ordered and prescription drug management: Meds ordered this encounter  Medications   iohexol (OMNIPAQUE) 350 MG/ML injection 75 mL   cefTRIAXone (ROCEPHIN) 1 g in sodium chloride 0.9 % 100 mL IVPB    Order Specific Question:   Antibiotic Indication:    Answer:   UTI   acetaminophen (TYLENOL) tablet 650 mg   cephALEXin (KEFLEX) 500 MG capsule    Sig: Take 1 capsule (500 mg total) by mouth 4 (four) times daily for 7 days.    Dispense:  28 capsule    Refill:  0     Order Specific Question:   Supervising Provider    Answer:   Sabra Heck, [place]    -I have reviewed the patients home medicines and have made adjustments as needed   Co morbidities that complicate the patient evaluation  Past Medical History:  Diagnosis Date   Arthritis    Elevated blood pressure reading 08/10/2018   Sinus drainage    chronic ; "just makes me cough"       Dispostion: Patient is appropriate for discharge.  Discharged in stable condition.  Return precaution discussed.  Medicated for admission.  Abnormal CT finding can be followed up outpatient.   Final Clinical Impression(s) / ED Diagnoses Final diagnoses:  Left lower quadrant abdominal pain  Lower urinary tract infectious disease  Pelvic mass    Rx / DC Orders ED Discharge Orders          Ordered    cephALEXin (KEFLEX) 500 MG capsule  4 times daily        07/26/22 1514              Evlyn Courier, PA-C 07/26/22 [place], [place], MD 07/28/22 2357

## 2022-07-28 LAB — URINE CULTURE: Culture: NO GROWTH

## 2022-08-03 ENCOUNTER — Other Ambulatory Visit: Payer: Self-pay | Admitting: Family Medicine

## 2022-08-03 DIAGNOSIS — N838 Other noninflammatory disorders of ovary, fallopian tube and broad ligament: Secondary | ICD-10-CM

## 2022-08-03 DIAGNOSIS — N2889 Other specified disorders of kidney and ureter: Secondary | ICD-10-CM

## 2022-08-03 DIAGNOSIS — I10 Essential (primary) hypertension: Secondary | ICD-10-CM | POA: Diagnosis not present

## 2022-08-03 DIAGNOSIS — K862 Cyst of pancreas: Secondary | ICD-10-CM | POA: Diagnosis not present

## 2022-08-18 ENCOUNTER — Ambulatory Visit
Admission: RE | Admit: 2022-08-18 | Discharge: 2022-08-18 | Disposition: A | Discharge status: Home / Self Care | Payer: Medicare Other | Source: Ambulatory Visit | Attending: Family Medicine | Admitting: Family Medicine

## 2022-08-18 DIAGNOSIS — N2889 Other specified disorders of kidney and ureter: Secondary | ICD-10-CM

## 2022-08-18 DIAGNOSIS — N838 Other noninflammatory disorders of ovary, fallopian tube and broad ligament: Secondary | ICD-10-CM

## 2022-08-18 MED ORDER — GADOPICLENOL 0.5 MMOL/ML IV SOLN
10.0000 mL | Freq: Once | INTRAVENOUS | Status: AC | PRN
  Administered 2022-08-18: 10 mL via INTRAVENOUS

## 2022-08-24 DIAGNOSIS — R350 Frequency of micturition: Secondary | ICD-10-CM | POA: Diagnosis not present

## 2022-08-25 DIAGNOSIS — Z1329 Encounter for screening for other suspected endocrine disorder: Secondary | ICD-10-CM | POA: Diagnosis not present

## 2022-08-25 DIAGNOSIS — Z131 Encounter for screening for diabetes mellitus: Secondary | ICD-10-CM | POA: Diagnosis not present

## 2022-09-01 DIAGNOSIS — R319 Hematuria, unspecified: Secondary | ICD-10-CM | POA: Diagnosis not present

## 2022-09-12 DIAGNOSIS — E559 Vitamin D deficiency, unspecified: Secondary | ICD-10-CM | POA: Diagnosis not present

## 2022-09-20 DIAGNOSIS — E559 Vitamin D deficiency, unspecified: Secondary | ICD-10-CM | POA: Diagnosis not present

## 2022-09-23 DIAGNOSIS — E559 Vitamin D deficiency, unspecified: Secondary | ICD-10-CM | POA: Diagnosis not present

## 2022-09-27 DIAGNOSIS — E559 Vitamin D deficiency, unspecified: Secondary | ICD-10-CM | POA: Diagnosis not present

## 2022-10-04 DIAGNOSIS — E559 Vitamin D deficiency, unspecified: Secondary | ICD-10-CM | POA: Diagnosis not present

## 2022-10-18 DIAGNOSIS — E559 Vitamin D deficiency, unspecified: Secondary | ICD-10-CM | POA: Diagnosis not present

## 2022-10-25 DIAGNOSIS — E559 Vitamin D deficiency, unspecified: Secondary | ICD-10-CM | POA: Diagnosis not present

## 2022-10-25 DIAGNOSIS — F5101 Primary insomnia: Secondary | ICD-10-CM | POA: Diagnosis not present

## 2022-11-15 DIAGNOSIS — E559 Vitamin D deficiency, unspecified: Secondary | ICD-10-CM | POA: Diagnosis not present

## 2023-02-13 DIAGNOSIS — F5101 Primary insomnia: Secondary | ICD-10-CM | POA: Diagnosis not present

## 2023-02-27 DIAGNOSIS — E559 Vitamin D deficiency, unspecified: Secondary | ICD-10-CM | POA: Diagnosis not present

## 2023-03-02 ENCOUNTER — Other Ambulatory Visit: Payer: Self-pay | Admitting: Family Medicine

## 2023-03-02 DIAGNOSIS — K869 Disease of pancreas, unspecified: Secondary | ICD-10-CM

## 2023-03-02 DIAGNOSIS — N839 Noninflammatory disorder of ovary, fallopian tube and broad ligament, unspecified: Secondary | ICD-10-CM

## 2023-03-13 DIAGNOSIS — E559 Vitamin D deficiency, unspecified: Secondary | ICD-10-CM | POA: Diagnosis not present

## 2023-03-27 DIAGNOSIS — E559 Vitamin D deficiency, unspecified: Secondary | ICD-10-CM | POA: Diagnosis not present

## 2023-04-17 ENCOUNTER — Ambulatory Visit
Admission: RE | Admit: 2023-04-17 | Discharge: 2023-04-17 | Disposition: A | Discharge status: Home / Self Care | Payer: Medicare Other | Source: Ambulatory Visit | Attending: Family Medicine | Admitting: Family Medicine

## 2023-04-17 DIAGNOSIS — N839 Noninflammatory disorder of ovary, fallopian tube and broad ligament, unspecified: Secondary | ICD-10-CM

## 2023-04-17 DIAGNOSIS — K7689 Other specified diseases of liver: Secondary | ICD-10-CM | POA: Diagnosis not present

## 2023-04-17 DIAGNOSIS — R9389 Abnormal findings on diagnostic imaging of other specified body structures: Secondary | ICD-10-CM | POA: Diagnosis not present

## 2023-04-17 DIAGNOSIS — K869 Disease of pancreas, unspecified: Secondary | ICD-10-CM

## 2023-04-17 MED ORDER — GADOPICLENOL 0.5 MMOL/ML IV SOLN
9.0000 mL | Freq: Once | INTRAVENOUS | Status: AC | PRN
  Administered 2023-04-17: 9 mL via INTRAVENOUS

## 2023-04-27 ENCOUNTER — Telehealth: Payer: Self-pay | Admitting: *Deleted

## 2023-04-27 NOTE — Telephone Encounter (Signed)
Spoke with the patient regarding the referral to GYN oncology. Patient scheduled as new patient with Dr Pricilla Holm  on 10/10 at 9 am. Patient given an arrival time of 8:30 am.  Explained to the patient the the doctor will perform a pelvic exam at this visit. Patient given the policy that only one visitor allowed and that visitor must be over 16 yrs are allowed in the Cancer Center. Patient given the address/phone number for the clinic and that the center offers free valet service. Patient aware that masks are option.

## 2023-05-09 ENCOUNTER — Encounter: Payer: Self-pay | Admitting: Gynecologic Oncology

## 2023-05-09 NOTE — Progress Notes (Signed)
GYNECOLOGIC ONCOLOGY NEW PATIENT CONSULTATION   Patient Name: [name]  Patient Age: 87 y.o. Date of Service: 05/11/23 Referring Provider: Irven Coe, MD [address] [place],  [place] [address]   Primary Care Provider: Maurice Small, MD (Inactive) Consulting Provider: Eugene Garnet, MD   Assessment/Plan:  Postmenopausal patient with complex adnexal mass, thickened endometrium.  Discussed imaging findings which show a mostly solid mass within the right adnexa, overall without significant change in size or appearance over approximately 1 year.  This was found incidentally and is asymptomatic.  Although my suspicion is that this is a benign mass, given imaging features and classified as ORADS 4, we discussed either continued close surveillance versus definitive surgery.  The patient's preference is to move forward with definitive surgery for diagnosis.  Plan to get tumor markers today.  Stressed that these are not diagnostic but help with her surgery.  They also discussed MRI findings most recently of thickened endometrium.  The patient has not had any vaginal bleeding.  Reviewed benign causes of endometrial thickening.  We also discussed that this can be a result of hyperplasia or malignancy.  We discussed performing an endometrial biopsy today versus doing this at the start of surgery and sending it for frozen section.  The patient's preference is to avoid doing this procedure today if possible.  We will plan to start with a dilation and curettage and send the endometrium for frozen section.  I reviewed that if malignancy is found, the uterus would be sent for frozen section as well and any additional procedures such as removal of lymph nodes would be decided based on pathology findings.  With regard to the adnexal mass, we discussed benign, borderline, and malignant diagnoses.  We also discussed the possibility that this is an estrogen secreting tumor that has caused  her endometrial thickening.  Plan will be to remove the enlarged ovary and send it for frozen section.  Depending on results, we discussed additional procedures that may be indicated including omentectomy, peritoneal biopsies, and lymphadenectomy.  Given her prolapse, which she describes is asymptomatic as well as her urinary incontinence, we discussed the utility of having her evaluated by urogynecology or urology prior to surgery in the event that a concurrent surgery is recommended to help with the symptoms.  She was open to this but does not want to delay definitive surgery with regard to her uterus and adnexa.  We will tentatively schedule her for surgery but I amended placing an urgent referral to see if we can get her at least evaluated prior to the date of surgery.  We reviewed the plan for a dilation and curettage, robotic assisted hysterectomy, bilateral salpingo-oophorectomy, possible staging including lymphadenectomy, peritoneal biopsies, omentectomy, possible laparotomy. The risks of surgery were discussed in detail and she understands these to include infection; wound separation; hernia; vaginal cuff separation, injury to adjacent organs such as bowel, bladder, blood vessels, ureters and nerves; bleeding which may require blood transfusion; anesthesia risk; thromboembolic events; possible death; unforeseen complications; possible need for re-exploration; medical complications such as heart attack, stroke, pleural effusion and pneumonia; and, if full lymphadenectomy is performed the risk of lymphedema and lymphocyst. The patient will receive DVT and antibiotic prophylaxis as indicated. She voiced a clear understanding. She had the opportunity to ask questions. Perioperative instructions were reviewed with her. Prescriptions for post-op medications were sent to her pharmacy of choice.  A copy of this note was sent to the patient's referring provider.  65 minutes of total time was spent for this  patient encounter, including preparation, face-to-face counseling with the patient and coordination of care, and documentation of the encounter.  Eugene Garnet, MD  Division of Gynecologic Oncology  Department of Obstetrics and Gynecology  [facility]  ___________________________________________  Chief Complaint: Chief Complaint  Patient presents with   Ovarian mass    History of Present Illness:  [name] is a 87 y.o. y.o. female who is seen in consultation at the request of Irven Coe, MD for an evaluation of a complex adnexal mass.  The patient initially developed left lower quadrant pain in December 2023.  CT of the abdomen and pelvis at that time showed a 3.7 cm incidental lobulated mass in the right adnexa with MRI recommended for further characterization.  1.1 cm mass centrally noted within the uterine fundus, thought to her represent a polyp or submucosal fibroid.  2 cm right sided uterine fibroid seen.  1.2 cm indeterminate lesion posterior midpole of the right kidney centimeter simple appearing cystic lesion noted within the pancreatic neck.  Follow-up MRI of the abdomen and pelvis in January of this year noted globular enlargement of the right ovary with suggestion of restricted diffusion.  Suspect underlying solid lesion such as a fibroma or thecoma within O RADS 4 given.  Given small size, suggestion for repeat MRI at 6 months.  Uterine fibroids again seen.  No convincing correlate within the endometrium although it is noted to be mildly prominent in the fundus.  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  O RADS 4 still.  Unchanged fluid signal cystic lesion within the pancreatic head measuring up to 2.7 cm.  Endometrial lining measures 1 cm.  Today, patient notes doing  very well.  She denies any abdominal or pelvic pain.  She endorses a good appetite although has been eating less and has achieved about 40 pounds of weight loss over the last year and a half.  She endorses baseline bowel and bladder function.  Denies any vaginal bleeding.  Patient denies needing to strain or push to have a bowel movement or to urinate.  She does have some urgency as well as urge urinary incontinence.  PAST MEDICAL HISTORY:  Past Medical History:  Diagnosis Date   Arthritis    Diverticulosis    Elevated blood pressure reading 08/10/2018   Sinus drainage    chronic ; "just makes me cough"      PAST SURGICAL HISTORY:  Past Surgical History:  Procedure Laterality Date   BILATERAL KNEE ARTHROSCOPY     cataract surgery  Bilateral severa; years ago   GALLBLADDER SURGERY     ROTATOR CUFF REPAIR Left    TOTAL KNEE ARTHROPLASTY Right 08/15/2018   Procedure: TOTAL KNEE ARTHROPLASTY;  Surgeon: Jene Every, MD;  Location: [facility];  Service: Orthopedics;  Laterality: Right;  120 minutes    OB/GYN HISTORY:  OB History  Gravida Para Term Preterm AB Living  2 2          SAB IAB Ectopic Multiple Live Births               # Outcome Date GA Lbr Len/2nd Weight Sex Type Anes PTL Lv  2 Para           1 Para  No LMP recorded. Patient is postmenopausal.  Age at menarche: 59  Age at menopause: 59 Hx of HRT: denies Hx of STDs: denies Last pap: unsure History of abnormal pap smears: denies  SCREENING STUDIES:  Last mammogram: 2022  Last colonoscopy: 2020 per her report Last bone mineral density: 2022  MEDICATIONS: Outpatient Encounter Medications as of 05/11/2023  Medication Sig   Cholecalciferol (VITAMIN D-3) 25 MCG (1000 UT) CAPS Take 1,000 Units by mouth daily.   docusate sodium (COLACE) 100 MG capsule Take 1 capsule (100 mg total) by mouth 2 (two) times daily.   fluticasone (FLONASE) 50 MCG/ACT nasal spray USE 2 SPRAY(S) IN EACH NOSTRIL ONCE DAILY AT NIGHT    hydroxypropyl methylcellulose / hypromellose (ISOPTO TEARS / GONIOVISC) 2.5 % ophthalmic solution Place 1 drop into both eyes daily as needed for dry eyes.   ibuprofen (ADVIL,MOTRIN) 200 MG tablet Take 400 mg by mouth every 6 (six) hours as needed for moderate pain.   Multiple Vitamin (MULTIVITAMIN WITH MINERALS) TABS tablet Take 1 tablet by mouth daily.   polyethylene glycol (MIRALAX / GLYCOLAX) packet Take 17 g by mouth daily.   [DISCONTINUED] aspirin 81 MG chewable tablet Chew 1 tablet (81 mg total) by mouth 2 (two) times daily.   [DISCONTINUED] ondansetron (ZOFRAN) 4 MG tablet Take 1 tablet (4 mg total) by mouth every 6 (six) hours as needed for nausea.   [DISCONTINUED] oxyCODONE (OXY IR/ROXICODONE) 5 MG immediate release tablet Take 1-2 tablets (5-10 mg total) by mouth every 4 (four) hours as needed for moderate pain (pain score 4-6).   No facility-administered encounter medications on file as of 05/11/2023.    ALLERGIES:  No Known Allergies   FAMILY HISTORY:  Family History  Problem Relation Age of Onset   Colon cancer Maternal Grandmother    Breast cancer Maternal Aunt    Ovarian cancer Neg Hx    Endometrial cancer Neg Hx    Pancreatic cancer Neg Hx    Prostate cancer Neg Hx      SOCIAL HISTORY:  Social Connections: Not on file    REVIEW OF SYSTEMS:  + cough, joint pain Denies appetite changes, fevers, chills, fatigue, unexplained weight changes. Denies hearing loss, neck lumps or masses, mouth sores, ringing in ears or voice changes. Denies wheezing.  Denies shortness of breath. Denies chest pain or palpitations. Denies leg swelling. Denies abdominal distention, pain, blood in stools, constipation, diarrhea, nausea, vomiting, or early satiety. Denies pain with intercourse, dysuria, frequency, hematuria or incontinence. Denies hot flashes, pelvic pain, vaginal bleeding or vaginal discharge.   Denies back pain or muscle pain/cramps. Denies itching, rash, or  wounds. Denies dizziness, headaches, numbness or seizures. Denies swollen lymph nodes or glands, denies easy bruising or bleeding. Denies anxiety, depression, confusion, or decreased concentration.  Physical Exam:  Vital Signs for this encounter:  Blood pressure (!) 155/72, pulse 70, temperature 98.4 F (36.9 C), resp. rate 20, weight 194 lb 3.2 oz (88.1 kg), SpO2 99%. Body mass index is 33.33 kg/m. General: Alert, oriented, no acute distress.  HEENT: Normocephalic, atraumatic. Sclera anicteric.  Chest: Clear to auscultation bilaterally. No wheezes, rhonchi, or rales. Cardiovascular: Regular rate and rhythm, no murmurs, rubs, or gallops.  Abdomen: Normoactive bowel sounds. Soft, nondistended, nontender to palpation. No masses or hepatosplenomegaly appreciated. No palpable fluid wave.  Extremities: Grossly normal range of motion. Warm, well perfused. No edema bilaterally.  Skin: No rashes or lesions.  Lymphatics: No cervical, supraclavicular, or inguinal adenopathy.  GU:  Normal external female  genitalia. No lesions. No discharge or bleeding.             Bladder/urethra:  No lesions or masses.  There is stage III pelvic organ prolapse.             Vagina: Mild vaginal atrophy.  Navigate redundancy of the anterior vagina.             Cervix: Normal appearing, no lesions.             Uterus: Small, mobile, no parametrial involvement or nodularity.             Adnexa: Fullness that is firm but overall smooth with high in the right pelvis.  Rectal: Deferred.  LABORATORY AND RADIOLOGIC DATA:  Outside medical records were reviewed to synthesize the above history, along with the history and physical obtained during the visit.   Lab Results  Component Value Date   WBC 8.7 07/26/2022   HGB 15.6 (H) 07/26/2022   HCT 46.0 07/26/2022   PLT 311 07/26/2022   GLUCOSE 114 (H) 07/26/2022   ALT 17 07/26/2022   AST 24 07/26/2022   NA 139 07/26/2022   K 4.4 07/26/2022   CL 105 07/26/2022    CREATININE 0.97 07/26/2022   BUN 12 07/26/2022   CO2 26 07/26/2022   INR 0.97 08/10/2018

## 2023-05-11 ENCOUNTER — Inpatient Hospital Stay: Payer: Medicare Other | Admitting: Gynecologic Oncology

## 2023-05-11 ENCOUNTER — Telehealth: Payer: Self-pay | Admitting: *Deleted

## 2023-05-11 ENCOUNTER — Encounter: Payer: Self-pay | Admitting: Gynecologic Oncology

## 2023-05-11 ENCOUNTER — Inpatient Hospital Stay: Payer: Medicare Other | Attending: Gynecologic Oncology | Admitting: Gynecologic Oncology

## 2023-05-11 VITALS — BP 156/82 | HR 70 | Temp 98.4°F | Resp 20 | Wt 194.2 lb

## 2023-05-11 DIAGNOSIS — K5790 Diverticulosis of intestine, part unspecified, without perforation or abscess without bleeding: Secondary | ICD-10-CM | POA: Diagnosis not present

## 2023-05-11 DIAGNOSIS — M1990 Unspecified osteoarthritis, unspecified site: Secondary | ICD-10-CM | POA: Insufficient documentation

## 2023-05-11 DIAGNOSIS — Z1211 Encounter for screening for malignant neoplasm of colon: Secondary | ICD-10-CM | POA: Insufficient documentation

## 2023-05-11 DIAGNOSIS — D398 Neoplasm of uncertain behavior of other specified female genital organs: Secondary | ICD-10-CM | POA: Diagnosis present

## 2023-05-11 DIAGNOSIS — Z803 Family history of malignant neoplasm of breast: Secondary | ICD-10-CM | POA: Insufficient documentation

## 2023-05-11 DIAGNOSIS — N3941 Urge incontinence: Secondary | ICD-10-CM | POA: Diagnosis not present

## 2023-05-11 DIAGNOSIS — Z800 Family history of malignant neoplasm of digestive organs: Secondary | ICD-10-CM | POA: Insufficient documentation

## 2023-05-11 DIAGNOSIS — N838 Other noninflammatory disorders of ovary, fallopian tube and broad ligament: Secondary | ICD-10-CM

## 2023-05-11 DIAGNOSIS — R9389 Abnormal findings on diagnostic imaging of other specified body structures: Secondary | ICD-10-CM | POA: Insufficient documentation

## 2023-05-11 DIAGNOSIS — N819 Female genital prolapse, unspecified: Secondary | ICD-10-CM

## 2023-05-11 DIAGNOSIS — E66811 Obesity, class 1: Secondary | ICD-10-CM

## 2023-05-11 DIAGNOSIS — Z1273 Encounter for screening for malignant neoplasm of ovary: Secondary | ICD-10-CM | POA: Diagnosis not present

## 2023-05-11 LAB — CEA (ACCESS): CEA (CHCC): 1.62 ng/mL (ref 0.00–5.00)

## 2023-05-11 MED ORDER — SENNOSIDES-DOCUSATE SODIUM 8.6-50 MG PO TABS: 2.0000 | ORAL_TABLET | Freq: Every day | ORAL | 0 refills | Status: DC

## 2023-05-11 MED ORDER — TRAMADOL HCL 50 MG PO TABS: 50.0000 mg | ORAL_TABLET | Freq: Four times a day (QID) | ORAL | 0 refills | Status: AC | PRN

## 2023-05-11 NOTE — Telephone Encounter (Signed)
Referral faxed to Dr. Marlou Porch with [facility].

## 2023-05-11 NOTE — Patient Instructions (Signed)
Preparing for your Surgery  Plan for surgery with Dr. Eugene Garnet at [facility]. You will be scheduled for dilation and curettage of the uterus vs endometrial biopsy at the beginning of surgery, robotic assisted total hysterectomy (removal of the uterus and cervix), bilateral salpingo-oophorectomy (removal of both ovaries and fallopian tubes), possible staging if a precancer or cancer is seen.   We are looking at October 16, 17, 23, 30, 31 as options for surgery. We are going to call the Urogynecologist office to see how soon you can be seen. We will call you with this and a date for surgery.  We will go ahead and schedule your surgery for Wednesday, October 16. We can always adjust if needed.  We are going to check lab work today.   Pre-operative Testing -You will receive a phone call from presurgical testing at [facility] to arrange for a pre-operative appointment and lab work.  -Bring your insurance card, copy of an advanced directive if applicable, medication list  -At that visit, you will be asked to sign a consent for a possible blood transfusion in case a transfusion becomes necessary during surgery.  The need for a blood transfusion is rare but having consent is a necessary part of your care.     -You should not be taking blood thinners or aspirin at least ten days prior to surgery unless instructed by your surgeon.  -Do not take supplements such as fish oil (omega 3), red yeast rice, turmeric before your surgery. You want to avoid medications with aspirin in them including headache powders such as BC or Goody's), Excedrin migraine.  Day Before Surgery at Home -You will be asked to take in a light diet the day before surgery. You will be advised you can have clear liquids up until 3 hours before your surgery.    Eat a light diet the day before surgery.  Examples including soups, broths, toast, yogurt, mashed potatoes.  AVOID GAS PRODUCING FOODS AND BEVERAGES.  Things to avoid include carbonated beverages (fizzy beverages, sodas), raw fruits and raw vegetables (uncooked), or beans.   If your bowels are filled with gas, your surgeon will have difficulty visualizing your pelvic organs which increases your surgical risks.  Your role in recovery Your role is to become active as soon as directed by your doctor, while still giving yourself time to heal.  Rest when you feel tired. You will be asked to do the following in order to speed your recovery:  - Cough and breathe deeply. This helps to clear and expand your lungs and can prevent pneumonia after surgery.  - STAY ACTIVE WHEN YOU GET HOME. Do mild physical activity. Walking or moving your legs help your circulation and body functions return to normal. Do not try to get up or walk alone the first time after surgery.   -If you develop swelling on one leg or the other, pain in the back of your leg, redness/warmth in one of your legs, please call the office or go to the Emergency Room to have a doppler to rule out a blood clot. For shortness of breath, chest pain-seek care in the Emergency Room as soon as possible. - Actively manage your pain. Managing your pain lets you move in comfort. We will ask you to rate your pain on a scale of zero to 10. It is your responsibility to tell your doctor or nurse where and how much you hurt so your pain can be treated.  Special Considerations -If you are diabetic, you may be placed on insulin after surgery to have closer control over your blood sugars to promote healing and recovery.  This does not mean that you will be discharged on insulin.  If applicable, your oral antidiabetics will be resumed when you are tolerating a solid diet.  -Your final pathology results from surgery should be available around one week after surgery and the results will be relayed to you when available.  -FMLA forms can be faxed to [phone] and please allow 5-7 business days for  completion.  Pain Management After Surgery -You will be prescribed your pain medication and bowel regimen medications before surgery so that you can have these available when you are discharged from the hospital. The pain medication is for use ONLY AFTER surgery and a new prescription will not be given.   -Make sure that you have Tylenol and Ibuprofen IF YOU ARE ABLE TO TAKE THESE MEDICATIONS at home to use on a regular basis after surgery for pain control. We recommend alternating the medications every hour to six hours since they work differently and are processed in the body differently for pain relief.  -Review the attached handout on narcotic use and their risks and side effects.   Bowel Regimen -You will be prescribed Sennakot-S to take nightly to prevent constipation especially if you are taking the narcotic pain medication intermittently.  It is important to prevent constipation and drink adequate amounts of liquids. You can stop taking this medication when you are not taking pain medication and you are back on your normal bowel routine. You can also continue using Miralax.  Risks of Surgery Risks of surgery are low but include bleeding, infection, damage to surrounding structures, re-operation, blood clots, and very rarely death.   Blood Transfusion Information (For the consent to be signed before surgery)  We will be checking your blood type before surgery so in case of emergencies, we will know what type of blood you would need.                                            WHAT IS A BLOOD TRANSFUSION?  A transfusion is the replacement of blood or some of its parts. Blood is made up of multiple cells which provide different functions. Red blood cells carry oxygen and are used for blood loss replacement. White blood cells fight against infection. Platelets control bleeding. Plasma helps clot blood. Other blood products are available for specialized needs, such as hemophilia or other  clotting disorders. BEFORE THE TRANSFUSION  Who gives blood for transfusions?  You may be able to donate blood to be used at a later date on yourself (autologous donation). Relatives can be asked to donate blood. This is generally not any safer than if you have received blood from a stranger. The same precautions are taken to ensure safety when a relative's blood is donated. Healthy volunteers who are fully evaluated to make sure their blood is safe. This is blood bank blood. Transfusion therapy is the safest it has ever been in the practice of medicine. Before blood is taken from a donor, a complete history is taken to make sure that person has no history of diseases nor engages in risky social behavior (examples are intravenous drug use or sexual activity with multiple partners). The donor's travel history is screened to minimize risk  of transmitting infections, such as malaria. The donated blood is tested for signs of infectious diseases, such as HIV and hepatitis. The blood is then tested to be sure it is compatible with you in order to minimize the chance of a transfusion reaction. If you or a relative donates blood, this is often done in anticipation of surgery and is not appropriate for emergency situations. It takes many days to process the donated blood. RISKS AND COMPLICATIONS Although transfusion therapy is very safe and saves many lives, the main dangers of transfusion include:  Getting an infectious disease. Developing a transfusion reaction. This is an allergic reaction to something in the blood you were given. Every precaution is taken to prevent this. The decision to have a blood transfusion has been considered carefully by your caregiver before blood is given. Blood is not given unless the benefits outweigh the risks.  AFTER SURGERY INSTRUCTIONS  Return to work: 4-6 weeks if applicable  Activity: 1. Be up and out of the bed during the day.  Take a nap if needed.  You may walk up  steps but be careful and use the hand rail.  Stair climbing will tire you more than you think, you may need to stop part way and rest.   2. No lifting or straining for 6 weeks over 10 pounds. No pushing, pulling, straining for 6 weeks.  3. No driving for around 1 week(s).  Do not drive if you are taking narcotic pain medicine and make sure that your reaction time has returned.   4. You can shower as soon as the next day after surgery. Shower daily.  Use your regular soap and water (not directly on the incision) and pat your incision(s) dry afterwards; don't rub.  No tub baths or submerging your body in water until cleared by your surgeon. If you have the soap that was given to you by pre-surgical testing that was used before surgery, you do not need to use it afterwards because this can irritate your incisions.   5. No sexual activity and nothing in the vagina for 8-10 weeks.  6. You may experience a small amount of clear drainage from your incisions, which is normal.  If the drainage persists, increases, or changes color please call the office.  7. Do not use creams, lotions, or ointments such as neosporin on your incisions after surgery until advised by your surgeon because they can cause removal of the dermabond glue on your incisions.    8. You may experience vaginal spotting after surgery or when the stitches at the top of the vagina begin to dissolve.  The spotting is normal but if you experience heavy bleeding, call our office.  9. Take Tylenol or ibuprofen first for pain if you are able to take these medications and only use narcotic pain medication for severe pain not relieved by the Tylenol or Ibuprofen.  Monitor your Tylenol intake to a max of 4,000 mg in a 24 hour period. You can alternate these medications after surgery.  Diet: 1. Low sodium Heart Healthy Diet is recommended but you are cleared to resume your normal (before surgery) diet after your procedure.  2. It is safe to use a  laxative, such as Miralax or Colace, if you have difficulty moving your bowels. You have been prescribed Sennakot-S to take at bedtime every evening after surgery to keep bowel movements regular and to prevent constipation.  You can also continue using miralax instead.  Wound Care: 1. Keep  clean and dry.  Shower daily.  Reasons to call the Doctor: Fever - Oral temperature greater than 100.4 degrees Fahrenheit Foul-smelling vaginal discharge Difficulty urinating Nausea and vomiting Increased pain at the site of the incision that is unrelieved with pain medicine. Difficulty breathing with or without chest pain New calf pain especially if only on one side Sudden, continuing increased vaginal bleeding with or without clots.   Contacts: For questions or concerns you should contact:  Dr. Eugene Garnet at [phone]  Warner Mccreedy, NP at [phone]  After Hours: call [phone] and have the GYN Oncologist paged/contacted (after 5 pm or on the weekends). You will speak with an after hours RN and let he or she know you have had surgery.  Messages sent via mychart are for non-urgent matters and are not responded to after hours so for urgent needs, please call the after hours number.

## 2023-05-11 NOTE — Telephone Encounter (Signed)
Spoke with [name] and relayed message from Dr. Pricilla Holm that first blood test is negative and normal. Pt verbalized understanding and thanked the office for calling.

## 2023-05-11 NOTE — Patient Instructions (Addendum)
Preparing for your Surgery  Plan for surgery with Dr. Eugene Garnet at [facility]. You will be scheduled for dilation and curettage of the uterus vs endometrial biopsy at the beginning of surgery, robotic assisted total hysterectomy (removal of the uterus and cervix), bilateral salpingo-oophorectomy (removal of both ovaries and fallopian tubes), possible staging if a precancer or cancer is seen.   We are looking at October 16, 17, 23, 30, 31 as options for surgery. We are going to call the Urogynecologist office to see how soon you can be seen. We will call you with this and a date for surgery.  We will go ahead and schedule your surgery for Wednesday, October 16. We can always adjust if needed.  We are going to check lab work today.   Pre-operative Testing -You will receive a phone call from presurgical testing at [facility] to arrange for a pre-operative appointment and lab work.  -Bring your insurance card, copy of an advanced directive if applicable, medication list  -At that visit, you will be asked to sign a consent for a possible blood transfusion in case a transfusion becomes necessary during surgery.  The need for a blood transfusion is rare but having consent is a necessary part of your care.     -You should not be taking blood thinners or aspirin at least ten days prior to surgery unless instructed by your surgeon.  -Do not take supplements such as fish oil (omega 3), red yeast rice, turmeric before your surgery. You want to avoid medications with aspirin in them including headache powders such as BC or Goody's), Excedrin migraine.  Day Before Surgery at Home -You will be asked to take in a light diet the day before surgery. You will be advised you can have clear liquids up until 3 hours before your surgery.    Eat a light diet the day before surgery.  Examples including soups, broths, toast, yogurt, mashed potatoes.  AVOID GAS PRODUCING FOODS AND BEVERAGES.  Things to avoid include carbonated beverages (fizzy beverages, sodas), raw fruits and raw vegetables (uncooked), or beans.   If your bowels are filled with gas, your surgeon will have difficulty visualizing your pelvic organs which increases your surgical risks.  Your role in recovery Your role is to become active as soon as directed by your doctor, while still giving yourself time to heal.  Rest when you feel tired. You will be asked to do the following in order to speed your recovery:  - Cough and breathe deeply. This helps to clear and expand your lungs and can prevent pneumonia after surgery.  - STAY ACTIVE WHEN YOU GET HOME. Do mild physical activity. Walking or moving your legs help your circulation and body functions return to normal. Do not try to get up or walk alone the first time after surgery.   -If you develop swelling on one leg or the other, pain in the back of your leg, redness/warmth in one of your legs, please call the office or go to the Emergency Room to have a doppler to rule out a blood clot. For shortness of breath, chest pain-seek care in the Emergency Room as soon as possible. - Actively manage your pain. Managing your pain lets you move in comfort. We will ask you to rate your pain on a scale of zero to 10. It is your responsibility to tell your doctor or nurse where and how much you hurt so your pain can be treated.  Special Considerations -If you are diabetic, you may be placed on insulin after surgery to have closer control over your blood sugars to promote healing and recovery.  This does not mean that you will be discharged on insulin.  If applicable, your oral antidiabetics will be resumed when you are tolerating a solid diet.  -Your final pathology results from surgery should be available around one week after surgery and the results will be relayed to you when available.  -FMLA forms can be faxed to [phone] and please allow 5-7 business days for  completion.  Pain Management After Surgery -You will be prescribed your pain medication and bowel regimen medications before surgery so that you can have these available when you are discharged from the hospital. The pain medication is for use ONLY AFTER surgery and a new prescription will not be given.   -Make sure that you have Tylenol and Ibuprofen IF YOU ARE ABLE TO TAKE THESE MEDICATIONS at home to use on a regular basis after surgery for pain control. We recommend alternating the medications every hour to six hours since they work differently and are processed in the body differently for pain relief.  -Review the attached handout on narcotic use and their risks and side effects.   Bowel Regimen -You will be prescribed Sennakot-S to take nightly to prevent constipation especially if you are taking the narcotic pain medication intermittently.  It is important to prevent constipation and drink adequate amounts of liquids. You can stop taking this medication when you are not taking pain medication and you are back on your normal bowel routine. You can also continue using Miralax.  Risks of Surgery Risks of surgery are low but include bleeding, infection, damage to surrounding structures, re-operation, blood clots, and very rarely death.   Blood Transfusion Information (For the consent to be signed before surgery)  We will be checking your blood type before surgery so in case of emergencies, we will know what type of blood you would need.                                            WHAT IS A BLOOD TRANSFUSION?  A transfusion is the replacement of blood or some of its parts. Blood is made up of multiple cells which provide different functions. Red blood cells carry oxygen and are used for blood loss replacement. White blood cells fight against infection. Platelets control bleeding. Plasma helps clot blood. Other blood products are available for specialized needs, such as hemophilia or other  clotting disorders. BEFORE THE TRANSFUSION  Who gives blood for transfusions?  You may be able to donate blood to be used at a later date on yourself (autologous donation). Relatives can be asked to donate blood. This is generally not any safer than if you have received blood from a stranger. The same precautions are taken to ensure safety when a relative's blood is donated. Healthy volunteers who are fully evaluated to make sure their blood is safe. This is blood bank blood. Transfusion therapy is the safest it has ever been in the practice of medicine. Before blood is taken from a donor, a complete history is taken to make sure that person has no history of diseases nor engages in risky social behavior (examples are intravenous drug use or sexual activity with multiple partners). The donor's travel history is screened to minimize risk  of transmitting infections, such as malaria. The donated blood is tested for signs of infectious diseases, such as HIV and hepatitis. The blood is then tested to be sure it is compatible with you in order to minimize the chance of a transfusion reaction. If you or a relative donates blood, this is often done in anticipation of surgery and is not appropriate for emergency situations. It takes many days to process the donated blood. RISKS AND COMPLICATIONS Although transfusion therapy is very safe and saves many lives, the main dangers of transfusion include:  Getting an infectious disease. Developing a transfusion reaction. This is an allergic reaction to something in the blood you were given. Every precaution is taken to prevent this. The decision to have a blood transfusion has been considered carefully by your caregiver before blood is given. Blood is not given unless the benefits outweigh the risks.  AFTER SURGERY INSTRUCTIONS  Return to work: 4-6 weeks if applicable  Activity: 1. Be up and out of the bed during the day.  Take a nap if needed.  You may walk up  steps but be careful and use the hand rail.  Stair climbing will tire you more than you think, you may need to stop part way and rest.   2. No lifting or straining for 6 weeks over 10 pounds. No pushing, pulling, straining for 6 weeks.  3. No driving for around 1 week(s).  Do not drive if you are taking narcotic pain medicine and make sure that your reaction time has returned.   4. You can shower as soon as the next day after surgery. Shower daily.  Use your regular soap and water (not directly on the incision) and pat your incision(s) dry afterwards; don't rub.  No tub baths or submerging your body in water until cleared by your surgeon. If you have the soap that was given to you by pre-surgical testing that was used before surgery, you do not need to use it afterwards because this can irritate your incisions.   5. No sexual activity and nothing in the vagina for 8-10 weeks.  6. You may experience a small amount of clear drainage from your incisions, which is normal.  If the drainage persists, increases, or changes color please call the office.  7. Do not use creams, lotions, or ointments such as neosporin on your incisions after surgery until advised by your surgeon because they can cause removal of the dermabond glue on your incisions.    8. You may experience vaginal spotting after surgery or when the stitches at the top of the vagina begin to dissolve.  The spotting is normal but if you experience heavy bleeding, call our office.  9. Take Tylenol or ibuprofen first for pain if you are able to take these medications and only use narcotic pain medication for severe pain not relieved by the Tylenol or Ibuprofen.  Monitor your Tylenol intake to a max of 4,000 mg in a 24 hour period. You can alternate these medications after surgery.  Diet: 1. Low sodium Heart Healthy Diet is recommended but you are cleared to resume your normal (before surgery) diet after your procedure.  2. It is safe to use a  laxative, such as Miralax or Colace, if you have difficulty moving your bowels. You have been prescribed Sennakot-S to take at bedtime every evening after surgery to keep bowel movements regular and to prevent constipation.  You can also continue using miralax instead.  Wound Care: 1. Keep  clean and dry.  Shower daily.  Reasons to call the Doctor: Fever - Oral temperature greater than 100.4 degrees Fahrenheit Foul-smelling vaginal discharge Difficulty urinating Nausea and vomiting Increased pain at the site of the incision that is unrelieved with pain medicine. Difficulty breathing with or without chest pain New calf pain especially if only on one side Sudden, continuing increased vaginal bleeding with or without clots.   Contacts: For questions or concerns you should contact:  Dr. Eugene Garnet at [phone]  Warner Mccreedy, NP at [phone]  After Hours: call [phone] and have the GYN Oncologist paged/contacted (after 5 pm or on the weekends). You will speak with an after hours RN and let he or she know you have had surgery.  Messages sent via mychart are for non-urgent matters and are not responded to after hours so for urgent needs, please call the after hours number.

## 2023-05-11 NOTE — Telephone Encounter (Signed)
-----   Message from Carver Fila sent at 05/11/2023  3:41 PM EDT ----- Please let her know that first blood test is negative/normal. Thank you!

## 2023-05-11 NOTE — Progress Notes (Signed)
Patient here for new patient consultation with Dr. Pricilla Holm and for a pre-operative appointment prior to her scheduled surgery on probable May 17, 2023. She is scheduled for a dilation and curettage of the uterus vs endometrial biopsy at the beginning of surgery, robotic assisted total hysterectomy, bilateral salpingo-oophorectomy, possible staging if a precancer or cancer is seen. The surgery was discussed in detail.  See after visit summary for additional details.    Discussed post-op pain management in detail including the aspects of the enhanced recovery pathway.  Advised her that a new prescription would be sent in for tramadol and it is only to be used for after her upcoming surgery.  We discussed the use of tylenol post-op and to monitor for a maximum of 4,000 mg in a 24 hour period.  Also prescribed sennakot to be used after surgery and to hold if having loose stools.  Discussed bowel regimen in detail.     Discussed measures to take at home to prevent DVT including frequent mobility.  Reportable signs and symptoms of DVT discussed. Post-operative instructions discussed and expectations for after surgery. Incisional care discussed as well including reportable signs and symptoms including erythema, drainage, wound separation.     10 minutes spent with the patient.  Verbalizing understanding of material discussed. No needs or concerns voiced at the end of the visit.   Advised patient to call for any needs.  Advised that her post-operative medications had been prescribed and could be picked up at any time.    This appointment is included in the global surgical bundle as pre-operative teaching and has no charge.

## 2023-05-12 ENCOUNTER — Telehealth: Payer: Self-pay | Admitting: *Deleted

## 2023-05-12 LAB — CA 125: Cancer Antigen (CA) 125: 9.8 U/mL (ref 0.0–38.1)

## 2023-05-12 LAB — CANCER ANTIGEN 19-9: CA 19-9: 6 U/mL (ref 0–35)

## 2023-05-12 NOTE — H&P (View-Only) (Signed)
Please let her know two other tumor markers are normal/negative

## 2023-05-12 NOTE — Telephone Encounter (Signed)
Received clearance

## 2023-05-12 NOTE — Progress Notes (Signed)
Please let her know two other tumor markers are normal/negative

## 2023-05-12 NOTE — Telephone Encounter (Signed)
-----   Message from Carver Fila sent at 05/12/2023  8:21 AM EDT ----- Please let her know two other tumor markers are normal/negative

## 2023-05-12 NOTE — Telephone Encounter (Signed)
Per Dr Tucker fax records and surgical optimization form to the patient's PCP office 

## 2023-05-12 NOTE — Telephone Encounter (Signed)
Spoke with Ms. Adelsberger this morning and relayed message from Dr.Tucker that two other tumor markers are normal/negative. Pt was happy with this news and stated that Dr.Herrick's office from [facility] called and she has an appt. With him on Monday.  Pt advised to call the office with any concerns or questions and the office would be reaching out on Tuesday for her pre-op call. Pt thanked the office for calling.

## 2023-05-13 NOTE — Patient Instructions (Addendum)
SURGICAL WAITING ROOM VISITATION Patients having surgery or a procedure may have no more than 2 support people in the waiting area - these visitors may rotate in the visitor waiting room.   Due to an increase in RSV and influenza rates and associated hospitalizations, children ages 84 and under may not visit patients in [facility] hospitals. If the patient needs to stay at the hospital during part of their recovery, the visitor guidelines for inpatient rooms apply.  PRE-OP VISITATION  Pre-op nurse will coordinate an appropriate time for 1 support person to accompany the patient in pre-op.  This support person may not rotate.  This visitor will be contacted when the time is appropriate for the visitor to come back in the pre-op area.  Please refer to the [facility] website for the visitor guidelines for Inpatients (after your surgery is over and you are in a regular room).  You are not required to quarantine at this time prior to your surgery. However, you must do this: Hand Hygiene often Do NOT share personal items Notify your provider if you are in close contact with someone who has COVID or you develop fever 100.4 or greater, new onset of sneezing, cough, sore throat, shortness of breath or body aches.  If you test positive for Covid or have been in contact with anyone that has tested positive in the last 10 days please notify you surgeon.    Your procedure is scheduled on:  Wednesday  May 17, 2023  Report to [facility] Main Entrance: [facility] entrance where the [employer] is available.   Report to admitting at: 1:15 PM  Call this number if you have any questions or problems the morning of surgery [phone]  Do not eat food after Midnight the night prior to your surgery/procedure.  After Midnight you may have the following liquids until  12:30 PM DAY OF SURGERY  Clear Liquid Diet Water Black Coffee (sugar ok, NO MILK/CREAM OR CREAMERS)  Tea (sugar ok, NO  MILK/CREAM OR CREAMERS) regular and decaf                             Plain Jell-O  with no fruit (NO RED)                                           Fruit ices (not with fruit pulp, NO RED)                                     Popsicles (NO RED)                                                                  Juice: NO CITRUS JUICES: only apple, WHITE grape, WHITE cranberry Sports drinks like Gatorade or Powerade (NO RED)             FOLLOW ANY ADDITIONAL PRE OP INSTRUCTIONS YOU RECEIVED FROM YOUR SURGEON'S OFFICE!!!  Eat a light diet the day before surgery.  Examples including soups, broths, toast, yogurt, mashed  potatoes.  AVOID GAS PRODUCING FOODS. Things to avoid include carbonated beverages (fizzy beverages, sodas), raw fruits and raw vegetables (uncooked), or beans.      Oral Hygiene is also important to reduce your risk of infection.        Remember - BRUSH YOUR TEETH THE MORNING OF SURGERY WITH YOUR REGULAR TOOTHPASTE  Do NOT smoke after Midnight the night before surgery.  STOP TAKING all Vitamins, Herbs and supplements 1 week before your surgery.   Take ONLY these medicines the morning of surgery with A SIP OF WATER: none  You may not have any metal on your body including hair pins, jewelry, and body piercing  Do not wear make-up, lotions, powders, perfumes or deodorant  Do not wear nail polish including gel and S&S, artificial / acrylic nails, or any other type of covering on natural nails including finger and toenails. If you have artificial nails, gel coating, etc., that needs to be removed by a nail salon, Please have this removed prior to surgery. Not doing so may mean that your surgery could be cancelled or delayed if the Surgeon or anesthesia staff feels like they are unable to monitor you safely.   Do not shave 48 hours prior to surgery to avoid nicks in your skin which may contribute to postoperative infections.   Contacts, Hearing Aids, dentures or bridgework may not  be worn into surgery. DENTURES WILL BE REMOVED PRIOR TO SURGERY PLEASE DO NOT APPLY "Poly grip" OR ADHESIVES!!!  You may bring a small overnight bag with you on the day of surgery, only pack items that are not valuable. [facility] IS NOT RESPONSIBLE   FOR VALUABLES THAT ARE LOST OR STOLEN.   Patients discharged on the day of surgery will not be allowed to drive home.  Someone NEEDS to stay with you for the first 24 hours after anesthesia.  Do not bring your home medications to the hospital. The Pharmacy will dispense medications listed on your medication list to you during your admission in the Hospital.  Special Instructions: Bring a copy of your healthcare power of attorney and living will documents the day of surgery, if you wish to have them scanned into your [facility] Records- EPIC  Please read over the following fact sheets you were given: IF YOU HAVE QUESTIONS ABOUT YOUR PRE-OP INSTRUCTIONS, PLEASE CALL [phone]   [facility] - Preparing for Surgery Before surgery, you can play an important role.  Because skin is not sterile, your skin needs to be as free of germs as possible.  You can reduce the number of germs on your skin by washing with CHG (chlorahexidine gluconate) soap before surgery.  CHG is an antiseptic cleaner which kills germs and bonds with the skin to continue killing germs even after washing. Please DO NOT use if you have an allergy to CHG or antibacterial soaps.  If your skin becomes reddened/irritated stop using the CHG and inform your nurse when you arrive at Short Stay. Do not shave (including legs and underarms) for at least 48 hours prior to the first CHG shower.  You may shave your face/neck.  Please follow these instructions carefully:  1.  Shower with CHG Soap the night before surgery and the  morning of surgery.  2.  If you choose to wash your hair, wash your hair first as usual with your normal  shampoo.  3.  After you shampoo, rinse your hair and  body thoroughly to remove the shampoo.  4.  Use CHG as you would any other liquid soap.  You can apply chg directly to the skin and wash.  Gently with a scrungie or clean washcloth.  5.  Apply the CHG Soap to your body ONLY FROM THE NECK DOWN.   Do not use on face/ open                           Wound or open sores. Avoid contact with eyes, ears mouth and genitals (private parts).                       Wash face,  Genitals (private parts) with your normal soap.             6.  Wash thoroughly, paying special attention to the area where your  surgery  will be performed.  7.  Thoroughly rinse your body with warm water from the neck down.  8.  DO NOT shower/wash with your normal soap after using and rinsing off the CHG Soap.            9.  Pat yourself dry with a clean towel.            10.  Wear clean pajamas.            11.  Place clean sheets on your bed the night of your first shower and do not  sleep with pets.  ON THE DAY OF SURGERY : Do not apply any lotions/deodorants the morning of surgery.  Please wear clean clothes to the hospital/surgery center.   FAILURE TO FOLLOW THESE INSTRUCTIONS MAY RESULT IN THE CANCELLATION OF YOUR SURGERY  PATIENT SIGNATURE_________________________________  NURSE SIGNATURE__________________________________  ________________________________________________________________________  .    Incentive Spirometer    An incentive spirometer is a tool that can help keep your lungs clear and active. This tool measures how well you are filling your lungs with each breath. Taking long deep breaths may help reverse or decrease the chance of developing breathing (pulmonary) problems (especially infection) following: A long period of time when you are unable to move or be active. BEFORE THE PROCEDURE  If the spirometer includes an indicator to show your best effort, your nurse or respiratory therapist will set it to a desired goal. If  possible, sit up straight or lean slightly forward. Try not to slouch. Hold the incentive spirometer in an upright position. INSTRUCTIONS FOR USE  Sit on the edge of your bed if possible, or sit up as far as you can in bed or on a chair. Hold the incentive spirometer in an upright position. Breathe out normally. Place the mouthpiece in your mouth and seal your lips tightly around it. Breathe in slowly and as deeply as possible, raising the piston or the ball toward the top of the column. Hold your breath for 3-5 seconds or for as long as possible. Allow the piston or ball to fall to the bottom of the column. Remove the mouthpiece from your mouth and breathe out normally. Rest for a few seconds and repeat Steps 1 through 7 at least 10 times every 1-2 hours when you are awake. Take your time and take a few normal breaths between deep breaths. The spirometer may include an indicator to show your best effort. Use the indicator as a goal to work toward during each repetition. After each set of 10 deep breaths, practice coughing to be sure your lungs  are clear. If you have an incision (the cut made at the time of surgery), support your incision when coughing by placing a pillow or rolled up towels firmly against it. Once you are able to get out of bed, walk around indoors and cough well. You may stop using the incentive spirometer when instructed by your caregiver.  RISKS AND COMPLICATIONS Take your time so you do not get dizzy or light-headed. If you are in pain, you may need to take or ask for pain medication before doing incentive spirometry. It is harder to take a deep breath if you are having pain. AFTER USE Rest and breathe slowly and easily. It can be helpful to keep track of a log of your progress. Your caregiver can provide you with a simple table to help with this. If you are using the spirometer at home, follow these instructions: SEEK MEDICAL CARE IF:  You are having difficultly using the  spirometer. You have trouble using the spirometer as often as instructed. Your pain medication is not giving enough relief while using the spirometer. You develop fever of 100.5 F (38.1 C) or higher.                                                                                                    SEEK IMMEDIATE MEDICAL CARE IF:  You cough up bloody sputum that had not been present before. You develop fever of 102 F (38.9 C) or greater. You develop worsening pain at or near the incision site. MAKE SURE YOU:  Understand these instructions. Will watch your condition. Will get help right away if you are not doing well or get worse. Document Released: 11/28/2006 Document Revised: 10/10/2011 Document Reviewed: 01/29/2007 [facility] Patient Information 2014 [place], [place].

## 2023-05-13 NOTE — Progress Notes (Addendum)
 COVID Vaccine received:  []  No [x]  Yes Date of any COVID positive Test in last 90 days:  None  PCP - Cheryle Frees, MD  at [facility]  [phone] Cardiologist - none  Chest x-ray -  EKG - at PST   05-15-2023 Stress Test -  ECHO -  Cardiac Cath -   PCR screen: []  Ordered & Completed [x]   No Order but Needs PROFEND     []   N/A for this surgery  Surgery Plan:  [x]  Ambulatory   []  Outpatient in bed  []  Admit Anesthesia:    [x]  General  []  Spinal  []   Choice []   MAC  Bowel Prep - [x]  No  []   Yes __But GYN Diet_  Pacemaker / ICD device [x]  No []  Yes   Spinal Cord Stimulator:[x]  No []  Yes       History of Sleep Apnea? [x]  No []  Yes   CPAP used?- [x]  No []  Yes    Does the patient monitor blood sugar?   [x]  N/A   []  No []  Yes  Patient has: [x]  NO Hx DM   []  Pre-DM   []  DM1  []   DM2  Blood Thinner / Instructions:  none Aspirin  Instructions:  none  ERAS Protocol Ordered: []  No  [x]  Yes PRE-SURGERY []  ENSURE  []  G2   [x]  No Drink Ordered Patient is to be NPO after: 12:30  Dental hx: []  Dentures:  [x]  N/A      []  Bridge or Partial:                   []  Loose or Damaged teeth:   Activity level: Patient is able / unable to climb a flight of stairs without difficulty; []  No CP  []  No SOB, but would have ___   Patient can / can not perform ADLs without assistance.   Anesthesia review: HTN- no meds,  CKD2, HOH- wears HAs  Patient denies shortness of breath, fever, cough and chest pain at PAT appointment.  Patient verbalized understanding and agreement to the Pre-Surgical Instructions that were given to them at this PAT appointment. Patient was also educated of the need to review these PAT instructions again prior to her surgery.I reviewed the appropriate phone numbers to call if they have any and questions or concerns.

## 2023-05-15 ENCOUNTER — Other Ambulatory Visit: Payer: Self-pay

## 2023-05-15 ENCOUNTER — Encounter (HOSPITAL_COMMUNITY): Payer: Self-pay

## 2023-05-15 ENCOUNTER — Other Ambulatory Visit: Payer: Self-pay | Admitting: Urology

## 2023-05-15 ENCOUNTER — Encounter (HOSPITAL_COMMUNITY)
Admission: RE | Admit: 2023-05-15 | Discharge: 2023-05-15 | Disposition: A | Discharge status: Home / Self Care | Payer: Medicare Other | Source: Ambulatory Visit | Attending: Gynecologic Oncology | Admitting: Gynecologic Oncology

## 2023-05-15 VITALS — BP 146/74 | HR 72 | Temp 98.4°F | Resp 18 | Ht 64.0 in | Wt 191.0 lb

## 2023-05-15 DIAGNOSIS — Z01818 Encounter for other preprocedural examination: Secondary | ICD-10-CM | POA: Insufficient documentation

## 2023-05-15 DIAGNOSIS — N8111 Cystocele, midline: Secondary | ICD-10-CM | POA: Diagnosis not present

## 2023-05-15 DIAGNOSIS — R9389 Abnormal findings on diagnostic imaging of other specified body structures: Secondary | ICD-10-CM | POA: Diagnosis not present

## 2023-05-15 DIAGNOSIS — N838 Other noninflammatory disorders of ovary, fallopian tube and broad ligament: Secondary | ICD-10-CM | POA: Diagnosis not present

## 2023-05-15 HISTORY — DX: Gastro-esophageal reflux disease without esophagitis: K21.9

## 2023-05-15 HISTORY — DX: Essential (primary) hypertension: I10

## 2023-05-15 HISTORY — DX: Chronic kidney disease, unspecified: N18.9

## 2023-05-15 LAB — COMPREHENSIVE METABOLIC PANEL
ALT: 19 U/L (ref 0–44)
AST: 21 U/L (ref 15–41)
Albumin: 4.1 g/dL (ref 3.5–5.0)
Alkaline Phosphatase: 56 U/L (ref 38–126)
Anion gap: 10 (ref 5–15)
BUN: 20 mg/dL (ref 8–23)
CO2: 25 mmol/L (ref 22–32)
Calcium: 9.5 mg/dL (ref 8.9–10.3)
Chloride: 105 mmol/L (ref 98–111)
Creatinine, Ser: 0.79 mg/dL (ref 0.44–1.00)
GFR, Estimated: >60 mL/min (ref >60)
Glucose, Bld: 103 mg/dL — ABNORMAL HIGH (ref 70–99)
Potassium: 4.1 mmol/L (ref 3.5–5.1)
Sodium: 140 mmol/L (ref 135–145)
Total Bilirubin: 0.5 mg/dL (ref 0.3–1.2)
Total Protein: 7.2 g/dL (ref 6.5–8.1)

## 2023-05-15 LAB — CBC
HCT: 44.3 % (ref 36.0–46.0)
Hemoglobin: 14.2 g/dL (ref 12.0–15.0)
MCH: 30 pg (ref 26.0–34.0)
MCHC: 32.1 g/dL (ref 30.0–36.0)
MCV: 93.5 fL (ref 80.0–100.0)
Platelets: 274 10*3/uL (ref 150–400)
RBC: 4.74 MIL/uL (ref 3.87–5.11)
RDW: 14.1 % (ref 11.5–15.5)
WBC: 5.4 10*3/uL (ref 4.0–10.5)
nRBC: 0 % (ref 0.0–0.2)

## 2023-05-16 ENCOUNTER — Telehealth: Payer: Self-pay | Admitting: *Deleted

## 2023-05-16 NOTE — Discharge Instructions (Signed)
AFTER SURGERY INSTRUCTIONS   Return to work: 4-6 weeks if applicable   Activity: 1. Be up and out of the bed during the day.  Take a nap if needed.  You may walk up steps but be careful and use the hand rail.  Stair climbing will tire you more than you think, you may need to stop part way and rest.    2. No lifting or straining for 6 weeks over 10 pounds. No pushing, pulling, straining for 6 weeks.   3. No driving for around 1 week(s).  Do not drive if you are taking narcotic pain medicine and make sure that your reaction time has returned.    4. You can shower as soon as the next day after surgery. Shower daily.  Use your regular soap and water (not directly on the incision) and pat your incision(s) dry afterwards; don't rub.  No tub baths or submerging your body in water until cleared by your surgeon. If you have the soap that was given to you by pre-surgical testing that was used before surgery, you do not need to use it afterwards because this can irritate your incisions.    5. No sexual activity and nothing in the vagina for 8-10 weeks.   6. You may experience a small amount of clear drainage from your incisions, which is normal.  If the drainage persists, increases, or changes color please call the office.   7. Do not use creams, lotions, or ointments such as neosporin on your incisions after surgery until advised by your surgeon because they can cause removal of the dermabond glue on your incisions.     8. You may experience vaginal spotting after surgery or when the stitches at the top of the vagina begin to dissolve.  The spotting is normal but if you experience heavy bleeding, call our office.   9. Take Tylenol or ibuprofen first for pain if you are able to take these medications and only use narcotic pain medication for severe pain not relieved by the Tylenol or Ibuprofen.  Monitor your Tylenol intake to a max of 4,000 mg in a 24 hour period. You can alternate these medications after  surgery.   Diet: 1. Low sodium Heart Healthy Diet is recommended but you are cleared to resume your normal (before surgery) diet after your procedure.   2. It is safe to use a laxative, such as Miralax or Colace, if you have difficulty moving your bowels. You have been prescribed Sennakot-S to take at bedtime every evening after surgery to keep bowel movements regular and to prevent constipation.  You can also continue using miralax instead.   Wound Care: 1. Keep clean and dry.  Shower daily.   Reasons to call the Doctor: Fever - Oral temperature greater than 100.4 degrees Fahrenheit Foul-smelling vaginal discharge Difficulty urinating Nausea and vomiting Increased pain at the site of the incision that is unrelieved with pain medicine. Difficulty breathing with or without chest pain New calf pain especially if only on one side Sudden, continuing increased vaginal bleeding with or without clots.   Contacts: For questions or concerns you should contact:   Dr. Eugene Garnet at [phone]   Warner Mccreedy, NP at [phone]   After Hours: call [phone] and have the GYN Oncologist paged/contacted (after 5 pm or on the weekends). You will speak with an after hours RN and let he or she know you have had surgery.   Messages sent via mychart are for non-urgent matters  and are not responded to after hours so for urgent needs, please call the after hours number.

## 2023-05-16 NOTE — Telephone Encounter (Signed)
Telephone call to check on pre-operative status.  Patient compliant with pre-operative instructions.  Reinforced nothing to eat after midnight. Clear liquids until 1215. Patient to arrive at 1315.  No questions or concerns voiced.  Instructed to call for any needs.

## 2023-05-17 ENCOUNTER — Ambulatory Visit (HOSPITAL_COMMUNITY): Payer: Medicare Other | Admitting: Anesthesiology

## 2023-05-17 ENCOUNTER — Encounter (HOSPITAL_COMMUNITY)
Admission: RE | Disposition: A | Discharge status: Home / Self Care | Payer: Self-pay | Source: Ambulatory Visit | Attending: Urology

## 2023-05-17 ENCOUNTER — Other Ambulatory Visit: Payer: Self-pay

## 2023-05-17 ENCOUNTER — Observation Stay (HOSPITAL_COMMUNITY)
Admission: RE | Admit: 2023-05-17 | Discharge: 2023-05-18 | Disposition: A | Discharge status: Home / Self Care | Payer: Medicare Other | Source: Ambulatory Visit | Attending: Urology | Admitting: Urology

## 2023-05-17 ENCOUNTER — Ambulatory Visit (HOSPITAL_BASED_OUTPATIENT_CLINIC_OR_DEPARTMENT_OTHER): Payer: Medicare Other | Admitting: Anesthesiology

## 2023-05-17 ENCOUNTER — Encounter (HOSPITAL_COMMUNITY): Payer: Self-pay | Admitting: Gynecologic Oncology

## 2023-05-17 DIAGNOSIS — N8111 Cystocele, midline: Secondary | ICD-10-CM | POA: Insufficient documentation

## 2023-05-17 DIAGNOSIS — N838 Other noninflammatory disorders of ovary, fallopian tube and broad ligament: Principal | ICD-10-CM

## 2023-05-17 DIAGNOSIS — N8500 Endometrial hyperplasia, unspecified: Principal | ICD-10-CM | POA: Insufficient documentation

## 2023-05-17 DIAGNOSIS — Z01818 Encounter for other preprocedural examination: Secondary | ICD-10-CM

## 2023-05-17 DIAGNOSIS — N858 Other specified noninflammatory disorders of uterus: Secondary | ICD-10-CM

## 2023-05-17 DIAGNOSIS — N819 Female genital prolapse, unspecified: Secondary | ICD-10-CM | POA: Diagnosis present

## 2023-05-17 DIAGNOSIS — N8189 Other female genital prolapse: Secondary | ICD-10-CM | POA: Diagnosis present

## 2023-05-17 DIAGNOSIS — R9389 Abnormal findings on diagnostic imaging of other specified body structures: Secondary | ICD-10-CM | POA: Diagnosis not present

## 2023-05-17 DIAGNOSIS — D270 Benign neoplasm of right ovary: Secondary | ICD-10-CM | POA: Insufficient documentation

## 2023-05-17 DIAGNOSIS — N8110 Cystocele, unspecified: Secondary | ICD-10-CM | POA: Diagnosis not present

## 2023-05-17 DIAGNOSIS — D259 Leiomyoma of uterus, unspecified: Secondary | ICD-10-CM | POA: Diagnosis not present

## 2023-05-17 HISTORY — PX: ROBOTIC ASSISTED LAPAROSCOPIC SACROCOLPOPEXY: SHX5388

## 2023-05-17 HISTORY — PX: DILATION AND CURETTAGE OF UTERUS: SHX78

## 2023-05-17 HISTORY — PX: ROBOTIC ASSISTED TOTAL HYSTERECTOMY WITH BILATERAL SALPINGO OOPHERECTOMY: SHX6086

## 2023-05-17 LAB — TYPE AND SCREEN
ABO/RH(D): O POS
Antibody Screen: NEGATIVE

## 2023-05-17 LAB — ABO/RH: ABO/RH(D): O POS

## 2023-05-17 SURGERY — DILATION AND CURETTAGE
Anesthesia: General

## 2023-05-17 MED ORDER — MORPHINE SULFATE (PF) 2 MG/ML IV SOLN
1.0000 mg | INTRAVENOUS | Status: DC | PRN
  Filled 2023-05-17 (×2): qty 1

## 2023-05-17 MED ORDER — ACETAMINOPHEN 500 MG PO TABS
1000.0000 mg | ORAL_TABLET | ORAL | Status: DC
  Filled 2023-05-17: qty 2

## 2023-05-17 MED ORDER — PHENYLEPHRINE 80 MCG/ML (10ML) SYRINGE FOR IV PUSH (FOR BLOOD PRESSURE SUPPORT)
PREFILLED_SYRINGE | INTRAVENOUS | Status: DC | PRN
  Administered 2023-05-17 (×2): 80 ug via INTRAVENOUS

## 2023-05-17 MED ORDER — OXYCODONE HCL 5 MG PO TABS: 5.0000 mg | ORAL_TABLET | Freq: Once | ORAL | Status: DC | PRN

## 2023-05-17 MED ORDER — DEXAMETHASONE SODIUM PHOSPHATE 10 MG/ML IJ SOLN
INTRAMUSCULAR | Status: AC
  Filled 2023-05-17: qty 1

## 2023-05-17 MED ORDER — ROCURONIUM BROMIDE 10 MG/ML (PF) SYRINGE
PREFILLED_SYRINGE | INTRAVENOUS | Status: AC
  Filled 2023-05-17: qty 10

## 2023-05-17 MED ORDER — MEPERIDINE HCL 50 MG/ML IJ SOLN: 6.2500 mg | INTRAMUSCULAR | Status: DC | PRN

## 2023-05-17 MED ORDER — ESTRADIOL 0.1 MG/GM VA CREA: 1.0000 | TOPICAL_CREAM | VAGINAL | 1 refills | Status: AC

## 2023-05-17 MED ORDER — AMISULPRIDE (ANTIEMETIC) 5 MG/2ML IV SOLN
INTRAVENOUS | Status: AC
  Filled 2023-05-17: qty 4

## 2023-05-17 MED ORDER — STERILE WATER FOR IRRIGATION IR SOLN
Status: DC | PRN
  Administered 2023-05-17: 500 mL

## 2023-05-17 MED ORDER — CEFAZOLIN SODIUM-DEXTROSE 2-4 GM/100ML-% IV SOLN
2.0000 g | INTRAVENOUS | Status: AC
  Administered 2023-05-17: 2 g via INTRAVENOUS
  Filled 2023-05-17: qty 100

## 2023-05-17 MED ORDER — FENTANYL CITRATE PF 50 MCG/ML IJ SOSY
PREFILLED_SYRINGE | INTRAMUSCULAR | Status: AC
  Administered 2023-05-17: 50 ug via INTRAVENOUS
  Filled 2023-05-17: qty 3

## 2023-05-17 MED ORDER — ZOLPIDEM TARTRATE 5 MG PO TABS: 5.0000 mg | ORAL_TABLET | Freq: Every evening | ORAL | Status: DC | PRN

## 2023-05-17 MED ORDER — BUPIVACAINE HCL 0.25 % IJ SOLN
INTRAMUSCULAR | Status: DC | PRN
  Administered 2023-05-17: 20 mL
  Administered 2023-05-17: 25 mL

## 2023-05-17 MED ORDER — PHENYLEPHRINE 80 MCG/ML (10ML) SYRINGE FOR IV PUSH (FOR BLOOD PRESSURE SUPPORT)
PREFILLED_SYRINGE | INTRAVENOUS | Status: AC
  Filled 2023-05-17: qty 10

## 2023-05-17 MED ORDER — ROCURONIUM BROMIDE 100 MG/10ML IV SOLN
INTRAVENOUS | Status: DC | PRN
  Administered 2023-05-17: 60 mg via INTRAVENOUS
  Administered 2023-05-17 (×2): 10 mg via INTRAVENOUS
  Administered 2023-05-17: 20 mg via INTRAVENOUS

## 2023-05-17 MED ORDER — LIDOCAINE HCL (CARDIAC) PF 100 MG/5ML IV SOSY
PREFILLED_SYRINGE | INTRAVENOUS | Status: DC | PRN
  Administered 2023-05-17: 40 mg via INTRAVENOUS

## 2023-05-17 MED ORDER — BUPIVACAINE HCL 0.25 % IJ SOLN
INTRAMUSCULAR | Status: AC
  Filled 2023-05-17: qty 1

## 2023-05-17 MED ORDER — MIDAZOLAM HCL 2 MG/2ML IJ SOLN: 0.5000 mg | Freq: Once | INTRAMUSCULAR | Status: DC | PRN

## 2023-05-17 MED ORDER — ONDANSETRON HCL 4 MG/2ML IJ SOLN
INTRAMUSCULAR | Status: DC | PRN
  Administered 2023-05-17: 4 mg via INTRAVENOUS

## 2023-05-17 MED ORDER — SULFAMETHOXAZOLE-TRIMETHOPRIM 800-160 MG PO TABS: 1.0000 | ORAL_TABLET | Freq: Two times a day (BID) | ORAL | 0 refills | Status: DC

## 2023-05-17 MED ORDER — LACTATED RINGERS IR SOLN
Status: DC | PRN
  Administered 2023-05-17: 1000 mL

## 2023-05-17 MED ORDER — ESTRADIOL 0.1 MG/GM VA CREA
TOPICAL_CREAM | VAGINAL | Status: AC
  Filled 2023-05-17: qty 42.5

## 2023-05-17 MED ORDER — DEXAMETHASONE SODIUM PHOSPHATE 10 MG/ML IJ SOLN
INTRAMUSCULAR | Status: DC | PRN
  Administered 2023-05-17: 10 mg via INTRAVENOUS

## 2023-05-17 MED ORDER — FENTANYL CITRATE (PF) 100 MCG/2ML IJ SOLN
INTRAMUSCULAR | Status: DC | PRN
  Administered 2023-05-17: 50 ug via INTRAVENOUS
  Administered 2023-05-17: 100 ug via INTRAVENOUS
  Administered 2023-05-17 (×3): 50 ug via INTRAVENOUS

## 2023-05-17 MED ORDER — BUPIVACAINE LIPOSOME 1.3 % IJ SUSP
INTRAMUSCULAR | Status: DC | PRN
  Administered 2023-05-17: 20 mL

## 2023-05-17 MED ORDER — LACTATED RINGERS IV SOLN
INTRAVENOUS | Status: DC | PRN
Start: 2023-05-17 — End: 2023-05-17

## 2023-05-17 MED ORDER — ESTRADIOL 0.1 MG/GM VA CREA
TOPICAL_CREAM | VAGINAL | Status: DC | PRN
  Administered 2023-05-17: 1 via VAGINAL

## 2023-05-17 MED ORDER — AMISULPRIDE (ANTIEMETIC) 5 MG/2ML IV SOLN
10.0000 mg | Freq: Once | INTRAVENOUS | Status: AC
  Administered 2023-05-17: 10 mg via INTRAVENOUS

## 2023-05-17 MED ORDER — HEPARIN SODIUM (PORCINE) 5000 UNIT/ML IJ SOLN
5000.0000 [IU] | INTRAMUSCULAR | Status: AC
  Administered 2023-05-17: 5000 [IU] via SUBCUTANEOUS
  Filled 2023-05-17: qty 1

## 2023-05-17 MED ORDER — OXYCODONE HCL 5 MG/5ML PO SOLN: 5.0000 mg | Freq: Once | ORAL | Status: DC | PRN

## 2023-05-17 MED ORDER — LIDOCAINE HCL (PF) 2 % IJ SOLN
INTRAMUSCULAR | Status: AC
  Filled 2023-05-17: qty 5

## 2023-05-17 MED ORDER — DEXAMETHASONE SODIUM PHOSPHATE 4 MG/ML IJ SOLN: 4.0000 mg | INTRAMUSCULAR | Status: DC

## 2023-05-17 MED ORDER — ORAL CARE MOUTH RINSE: 15.0000 mL | Freq: Once | OROMUCOSAL | Status: AC

## 2023-05-17 MED ORDER — ONDANSETRON HCL 4 MG/2ML IJ SOLN
INTRAMUSCULAR | Status: AC
  Filled 2023-05-17: qty 2

## 2023-05-17 MED ORDER — LACTATED RINGERS IV SOLN: INTRAVENOUS | Status: DC

## 2023-05-17 MED ORDER — SUGAMMADEX SODIUM 200 MG/2ML IV SOLN
INTRAVENOUS | Status: DC | PRN
  Administered 2023-05-17: 100 mg via INTRAVENOUS

## 2023-05-17 MED ORDER — SODIUM CHLORIDE (PF) 0.9 % IJ SOLN
INTRAMUSCULAR | Status: AC
  Filled 2023-05-17: qty 20

## 2023-05-17 MED ORDER — HYDRALAZINE HCL 20 MG/ML IJ SOLN: 5.0000 mg | INTRAMUSCULAR | Status: DC | PRN

## 2023-05-17 MED ORDER — FENTANYL CITRATE (PF) 100 MCG/2ML IJ SOLN
INTRAMUSCULAR | Status: AC
  Filled 2023-05-17: qty 2

## 2023-05-17 MED ORDER — KETOROLAC TROMETHAMINE 15 MG/ML IJ SOLN
15.0000 mg | Freq: Four times a day (QID) | INTRAMUSCULAR | Status: DC
  Administered 2023-05-17 – 2023-05-18 (×2): 15 mg via INTRAVENOUS
  Filled 2023-05-17 (×2): qty 1

## 2023-05-17 MED ORDER — FENTANYL CITRATE PF 50 MCG/ML IJ SOSY
25.0000 ug | PREFILLED_SYRINGE | INTRAMUSCULAR | Status: DC | PRN
  Administered 2023-05-17: 50 ug via INTRAVENOUS
  Administered 2023-05-17 (×2): 25 ug via INTRAVENOUS

## 2023-05-17 MED ORDER — CHLORHEXIDINE GLUCONATE 0.12 % MT SOLN
15.0000 mL | Freq: Once | OROMUCOSAL | Status: AC
  Administered 2023-05-17: 15 mL via OROMUCOSAL

## 2023-05-17 MED ORDER — ACETAMINOPHEN 10 MG/ML IV SOLN
1000.0000 mg | Freq: Four times a day (QID) | INTRAVENOUS | Status: DC
  Administered 2023-05-17 – 2023-05-18 (×2): 1000 mg via INTRAVENOUS
  Filled 2023-05-17 (×2): qty 100

## 2023-05-17 MED ORDER — PROPOFOL 10 MG/ML IV BOLUS
INTRAVENOUS | Status: AC
  Filled 2023-05-17: qty 20

## 2023-05-17 MED ORDER — ACETAMINOPHEN 325 MG PO TABS: 650.0000 mg | ORAL_TABLET | Freq: Four times a day (QID) | ORAL | Status: DC | PRN

## 2023-05-17 MED ORDER — TRAMADOL HCL 50 MG PO TABS
50.0000 mg | ORAL_TABLET | Freq: Four times a day (QID) | ORAL | Status: DC | PRN
  Administered 2023-05-17 – 2023-05-18 (×2): 100 mg via ORAL
  Filled 2023-05-17 (×2): qty 2

## 2023-05-17 MED ORDER — PROPOFOL 10 MG/ML IV BOLUS
INTRAVENOUS | Status: DC | PRN
  Administered 2023-05-17: 150 mg via INTRAVENOUS

## 2023-05-17 SURGICAL SUPPLY — 136 items
ADH SKN CLS APL DERMABOND .7 (GAUZE/BANDAGES/DRESSINGS) ×4
AGENT HMST KT MTR STRL THRMB (HEMOSTASIS)
APL ESCP 34 STRL LF DISP (HEMOSTASIS)
APL PRP STRL LF DISP 70% ISPRP (MISCELLANEOUS) ×2
APPLICATOR SURGIFLO ENDO (HEMOSTASIS) IMPLANT
BAG COUNTER SPONGE SURGICOUNT (BAG) IMPLANT
BAG DRN RND TRDRP ANRFLXCHMBR (UROLOGICAL SUPPLIES)
BAG LAPAROSCOPIC 12 15 PORT 16 (BASKET) IMPLANT
BAG RETRIEVAL 12/15 (BASKET)
BAG SPNG CNTER NS LX DISP (BAG)
BAG URINE DRAIN 2000ML AR STRL (UROLOGICAL SUPPLIES) IMPLANT
BLADE SURG SZ10 CARB STEEL (BLADE) IMPLANT
BRIEF MESH DISP LRG (UNDERPADS AND DIAPERS) IMPLANT
CATH FOLEY 2WAY SLVR 5CC 16FR (CATHETERS) ×2 IMPLANT
CATH ROBINSON RED A/P 16FR (CATHETERS) ×2 IMPLANT
CHLORAPREP W/TINT 26 (MISCELLANEOUS) ×2 IMPLANT
CLIP LIGATING HEM O LOK PURPLE (MISCELLANEOUS) ×2 IMPLANT
CLIP LIGATING HEMO LOK XL GOLD (MISCELLANEOUS) IMPLANT
CLIP LIGATING HEMO O LOK GREEN (MISCELLANEOUS) IMPLANT
CLIP SUT LAPRA TY ABSORB (SUTURE) IMPLANT
COVER BACK TABLE 60X90IN (DRAPES) ×4 IMPLANT
COVER SURGICAL LIGHT HANDLE (MISCELLANEOUS) ×2 IMPLANT
COVER TIP SHEARS 8 DVNC (MISCELLANEOUS) ×4 IMPLANT
CURETTE PIPELLE ENDOMTRL SUCTN (MISCELLANEOUS) IMPLANT
DERMABOND ADVANCED .7 DNX12 (GAUZE/BANDAGES/DRESSINGS) ×4 IMPLANT
DRAIN CHANNEL RND F F (WOUND CARE) IMPLANT
DRAPE ARM DVNC X/XI (DISPOSABLE) ×16 IMPLANT
DRAPE COLUMN DVNC XI (DISPOSABLE) ×4 IMPLANT
DRAPE INCISE IOBAN 66X45 STRL (DRAPES) ×2 IMPLANT
DRAPE SHEET LG 3/4 BI-LAMINATE (DRAPES) ×6 IMPLANT
DRAPE SURG IRRIG POUCH 19X23 (DRAPES) ×4 IMPLANT
DRAPE UNDERBUTTOCKS STRL (DISPOSABLE) ×2 IMPLANT
DRIVER NDL LRG 8 DVNC XI (INSTRUMENTS) ×4 IMPLANT
DRIVER NDL MEGA SUTCUT DVNCXI (INSTRUMENTS) ×2 IMPLANT
DRIVER NDLE LRG 8 DVNC XI (INSTRUMENTS) ×4 IMPLANT
DRIVER NDLE MEGA SUTCUT DVNCXI (INSTRUMENTS) ×2 IMPLANT
DRSG OPSITE POSTOP 4X6 (GAUZE/BANDAGES/DRESSINGS) IMPLANT
DRSG OPSITE POSTOP 4X8 (GAUZE/BANDAGES/DRESSINGS) IMPLANT
DRSG TELFA 3X8 NADH STRL (GAUZE/BANDAGES/DRESSINGS) ×2 IMPLANT
ELECT PENCIL ROCKER SW 15FT (MISCELLANEOUS) ×2 IMPLANT
ELECT REM PT RETURN 15FT ADLT (MISCELLANEOUS) ×4 IMPLANT
FORCEPS BPLR FENES DVNC XI (FORCEP) ×2 IMPLANT
FORCEPS BPLR LNG DVNC XI (INSTRUMENTS) ×2 IMPLANT
FORCEPS PROGRASP DVNC XI (FORCEP) ×4 IMPLANT
GAUZE 4X4 16PLY ~~LOC~~+RFID DBL (SPONGE) ×4 IMPLANT
GLOVE BIO SURGEON STRL SZ 6 (GLOVE) ×8 IMPLANT
GLOVE BIO SURGEON STRL SZ 6.5 (GLOVE) ×4 IMPLANT
GLOVE SURG LX STRL 7.5 STRW (GLOVE) ×6 IMPLANT
GOWN STRL REUS W/ TWL LRG LVL3 (GOWN DISPOSABLE) ×8 IMPLANT
GOWN STRL REUS W/ TWL XL LVL3 (GOWN DISPOSABLE) ×4 IMPLANT
GOWN STRL REUS W/TWL LRG LVL3 (GOWN DISPOSABLE) ×8
GOWN STRL REUS W/TWL XL LVL3 (GOWN DISPOSABLE) ×4
GOWN STRL SURGICAL XL XLNG (GOWN DISPOSABLE) ×2 IMPLANT
GRASPER SUT TROCAR 14GX15 (MISCELLANEOUS) IMPLANT
HOLDER FOLEY CATH W/STRAP (MISCELLANEOUS) ×2 IMPLANT
IRRIG SUCT STRYKERFLOW 2 WTIP (MISCELLANEOUS) ×4
IRRIGATION SUCT STRKRFLW 2 WTP (MISCELLANEOUS) ×4 IMPLANT
KIT BASIN OR (CUSTOM PROCEDURE TRAY) ×4 IMPLANT
KIT PROCEDURE DVNC SI (MISCELLANEOUS) IMPLANT
KIT TURNOVER KIT A (KITS) IMPLANT
LIGASURE IMPACT 36 18CM CVD LR (INSTRUMENTS) IMPLANT
MANIPULATOR ADVINCU DEL 3.0 PL (MISCELLANEOUS) IMPLANT
MANIPULATOR ADVINCU DEL 3.5 PL (MISCELLANEOUS) IMPLANT
MANIPULATOR UTERINE 4.5 ZUMI (MISCELLANEOUS) IMPLANT
MARKER SKIN DUAL TIP RULER LAB (MISCELLANEOUS) ×2 IMPLANT
MESH Y UPSYLON VAGINAL (Mesh General) IMPLANT
NDL HYPO 21X1.5 SAFETY (NEEDLE) ×2 IMPLANT
NDL SPNL 18GX3.5 QUINCKE PK (NEEDLE) IMPLANT
NDL SPNL 22GX3.5 QUINCKE BK (NEEDLE) ×2 IMPLANT
NEEDLE HYPO 21X1.5 SAFETY (NEEDLE) ×2 IMPLANT
NEEDLE SPNL 18GX3.5 QUINCKE PK (NEEDLE) IMPLANT
NEEDLE SPNL 22GX3.5 QUINCKE BK (NEEDLE) ×2 IMPLANT
NS IRRIG 1000ML POUR BTL (IV SOLUTION) ×2 IMPLANT
OBTURATOR OPTICAL STND 8 DVNC (TROCAR) ×2
OBTURATOR OPTICALSTD 8 DVNC (TROCAR) ×2 IMPLANT
OCCLUDER COLPOPNEUMO (BALLOONS) IMPLANT
PACK LITHOTOMY IV (CUSTOM PROCEDURE TRAY) ×2 IMPLANT
PACK ROBOT GYN CUSTOM WL (TRAY / TRAY PROCEDURE) ×2 IMPLANT
PACKING VAGINAL (PACKING) IMPLANT
PAD OB MATERNITY 4.3X12.25 (PERSONAL CARE ITEMS) ×2 IMPLANT
PAD POSITIONING PINK XL (MISCELLANEOUS) ×4 IMPLANT
PENCIL SMOKE EVACUATOR (MISCELLANEOUS) IMPLANT
PIPELLE ENDOMETRIAL SUCTION CU (MISCELLANEOUS) ×2
PORT ACCESS TROCAR AIRSEAL 12 (TROCAR) IMPLANT
SCISSORS LAP 5X45 EPIX DISP (ENDOMECHANICALS) IMPLANT
SCISSORS MNPLR CVD DVNC XI (INSTRUMENTS) ×4 IMPLANT
SCRUB CHG 4% DYNA-HEX 4OZ (MISCELLANEOUS) IMPLANT
SEAL UNIV 5-12 XI (MISCELLANEOUS) ×16 IMPLANT
SET IRRIG Y TYPE TUR BLADDER L (SET/KITS/TRAYS/PACK) IMPLANT
SET TRI-LUMEN FLTR TB AIRSEAL (TUBING) ×2 IMPLANT
SET TUBE SMOKE EVAC HIGH FLOW (TUBING) ×2 IMPLANT
SHEET LAVH (DRAPES) ×2 IMPLANT
SOL ELECTROSURG ANTI STICK (MISCELLANEOUS) ×2
SOL PREP POV-IOD 4OZ 10% (MISCELLANEOUS) ×2 IMPLANT
SOLUTION ELECTROSURG ANTI STCK (MISCELLANEOUS) ×2 IMPLANT
SPIKE FLUID TRANSFER (MISCELLANEOUS) ×2 IMPLANT
SPONGE T-LAP 18X18 ~~LOC~~+RFID (SPONGE) IMPLANT
SURGIFLO W/THROMBIN 8M KIT (HEMOSTASIS) IMPLANT
SURGILUBE 2OZ TUBE FLIPTOP (MISCELLANEOUS) ×2 IMPLANT
SUT MNCRL AB 4-0 PS2 18 (SUTURE) ×4 IMPLANT
SUT MON AB 3-0 SH 27 (SUTURE)
SUT MON AB 3-0 SH27 (SUTURE) IMPLANT
SUT PDS AB 1 TP1 96 (SUTURE) IMPLANT
SUT PROLENE 2 0 CT 1 (SUTURE) ×2 IMPLANT
SUT V-LOC 180 0-0 GS22 (SUTURE) IMPLANT
SUT VIC AB 0 CT1 27 (SUTURE) ×2
SUT VIC AB 0 CT1 27XBRD ANTBC (SUTURE) ×2 IMPLANT
SUT VIC AB 2-0 CT1 27 (SUTURE)
SUT VIC AB 2-0 CT1 TAPERPNT 27 (SUTURE) IMPLANT
SUT VIC AB 2-0 SH 27 (SUTURE) ×20
SUT VIC AB 2-0 SH 27XBRD (SUTURE) ×10 IMPLANT
SUT VIC AB 3-0 SH 27 (SUTURE) ×6
SUT VIC AB 3-0 SH 27X BRD (SUTURE) ×2 IMPLANT
SUT VIC AB 4-0 PS2 18 (SUTURE) ×4 IMPLANT
SUT VICRYL 0 27 CT2 27 ABS (SUTURE) ×2 IMPLANT
SUT VICRYL 0 UR6 27IN ABS (SUTURE) ×2 IMPLANT
SUT VLOC 180 0 9IN GS21 (SUTURE) IMPLANT
SUT VLOC 180 3-0 9IN GS21 (SUTURE) IMPLANT
SYR 10ML LL (SYRINGE) IMPLANT
SYR 50ML LL SCALE MARK (SYRINGE) IMPLANT
SYR BULB IRRIG 60ML STRL (SYRINGE) IMPLANT
SYS BAG RETRIEVAL 10MM (BASKET)
SYS WOUND ALEXIS 18CM MED (MISCELLANEOUS)
SYSTEM BAG RETRIEVAL 10MM (BASKET) IMPLANT
SYSTEM WOUND ALEXIS 18CM MED (MISCELLANEOUS) IMPLANT
TOWEL OR 17X26 10 PK STRL BLUE (TOWEL DISPOSABLE) ×4 IMPLANT
TOWEL OR NON WOVEN STRL DISP B (DISPOSABLE) ×2 IMPLANT
TRAP SPECIMEN MUCUS 40CC (MISCELLANEOUS) IMPLANT
TRAY FOLEY MTR SLVR 16FR STAT (SET/KITS/TRAYS/PACK) ×2 IMPLANT
TRAY LAPAROSCOPIC (CUSTOM PROCEDURE TRAY) ×2 IMPLANT
TROCAR PORT AIRSEAL 5X120 (TROCAR) IMPLANT
TROCAR Z THREAD OPTICAL 12X100 (TROCAR) ×2 IMPLANT
TROCAR Z-THREAD FIOS 5X100MM (TROCAR) ×2 IMPLANT
UNDERPAD 30X36 HEAVY ABSORB (UNDERPADS AND DIAPERS) ×4 IMPLANT
WATER STERILE IRR 1000ML POUR (IV SOLUTION) ×4 IMPLANT
YANKAUER SUCT BULB TIP 10FT TU (MISCELLANEOUS) IMPLANT

## 2023-05-17 NOTE — Anesthesia Procedure Notes (Signed)
Procedure Name: Intubation Date/Time: 05/17/2023 3:15 PM  Performed by: Laurita Quint, CRNAPre-anesthesia Checklist: Patient identified, Emergency Drugs available, Suction available and Patient being monitored Patient Re-evaluated:Patient Re-evaluated prior to induction Oxygen Delivery Method: Circle System Utilized Preoxygenation: Pre-oxygenation with 100% oxygen Induction Type: IV induction Ventilation: Mask ventilation without difficulty Laryngoscope Size: Miller and 2 Grade View: Grade I Tube type: Oral Tube size: 7.0 mm Number of attempts: 1 Airway Equipment and Method: Stylet and Oral airway Placement Confirmation: ETT inserted through vocal cords under direct vision, positive ETCO2 and breath sounds checked- equal and bilateral Tube secured with: Tape Dental Injury: Teeth and Oropharynx as per pre-operative assessment and Injury to lip  Comments: Small upper lip laceration

## 2023-05-17 NOTE — Op Note (Signed)
Preoperative diagnosis:  Pelvic organ prolapse   Postoperative diagnosis:  same   Procedure: Robotic assisted laparoscopic sacrocolpopexy  Surgeon: Crist Fat, MD 1st assistant: Carin Hock, MD  An assistant was required for this surgical procedure.  The duties of the assistant included but were not limited to suctioning, passing suture, camera manipulation, retraction. This procedure would not be able to be performed without an Geophysicist/field seismologist.   Anesthesia: General  Complications: None  Intraoperative findings: [employer] scientific Upsilon Y-Mesh placed, cut to specific vaginal length  EBL: 50cc  Specimens: None  Indication: [name] is a 87 y.o. female patient with symptomatic pelvic organ prolapse.  After reviewing the management options for treatment, he elected to proceed with the above surgical procedure(s). We have discussed the potential benefits and risks of the procedure, side effects of the proposed treatment, the likelihood of the patient achieving the goals of the procedure, and any potential problems that might occur during the procedure or recuperation. Informed consent has been obtained.  Description of procedure:  The robot was docked by Dr. Pricilla Holm for the hysterectomy portion of the surgery - please see her dictation for further details. I added a fourth robotic arm on the right lateral position in line with the previous three.  We then began our surgery by cleaning up some of the pelvic adhesions to the small bowel and colon.  Once this was completed I started dissecting at the sacral promontory located 3 cm medial to the location where the ureter crosses over the iliac vessels at the pelvic brim. The posterior peritoneum was incised and the sacral prominence cleared off an area taking care to avoid the middle sacral vessels and the iliac branches.  I then created a posterior peritoneal tunnel starting at the sacral promontory and tunneling down the right  pelvic sidewall down into the pelvis breaking back through the posterior peritoneum around the vesico-vaginal junction posteriorly.  I then continued the posterior dissection retracting down on the rectum and finding the avascular plane between the posterior vaginal wall and the rectum.  I carried this dissection down as far as I could to along the area of the perineal body.  I then turned my attention to the anterior plane between the anterior vaginal wall and the bladder.  I was able to obtain access to the avascular plane and with a combination of both monopolar cautery and blunt dissection was able to clean and nice down to the bladder neck.  I then turned my attention back to the patient's uterus and skeletonized the right uterine artery and vein and then took this with a series of bipolar moves.  I then performed a similar uterus pedicle ligation on the left.    Mesh was measured at approximately 6.5 cm anteriorly and 6.5 cm posteriorly and I cut this on the back table.  The mesh was then placed into the patient's abdomen through the assistant port and the anterior leaf was secured down onto the anterior vaginal wall with the apex at the bladder neck.  The posterior leaf was then secured down on the posterior vaginal wall.  These were sewn down with 2-0 Vicryl.  Between 6 and 8 were done on each side.  At this point I then went back to the previously dissected sacral promontory and posterior peritoneal tunnel and inserted a instrument through the tunnel and grasped the end of the mesh at the vaginal cuff and pull it up to the sacrum.  I then checked to  ensure that the sacral mesh was not too tight by performing a vaginal exam.  I then secured the sacral leg of the mesh using a 0 Prolene.  I then reapproximated the posterior peritoneum with a 2-0 Vicryl in a running fashion around the sacral promontory.  The pelvic peritoneum was closed using a pursestring.   The fascia of the 12 mm port was then closed with  0 Vicryl in a figure-of-eight fashion.  The skin was closed with 4-0 Monocryl's.  Dermabond was applied to the incision and exparel injected into the incisions.  Estrace impregnated packing was then placed into her vagina which will be left in overnight.  The patient was subsequently extubated and returned to the PACU in excellent condition.   Crist Fat, M.D.

## 2023-05-17 NOTE — Transfer of Care (Signed)
Immediate Anesthesia Transfer of Care Note  Patient: [name]  Procedure(s) Performed: DILATATION AND CURETTAGE XI ROBOTIC ASSISTED TOTAL HYSTERECTOMY WITH BILATERAL SALPINGO OOPHORECTOMY,POSSIBLE STAGING (Bilateral) XI ROBOTIC ASSISTED LAPAROSCOPIC SACROCOLPOPEXY  Patient Location: PACU  Anesthesia Type:General  Level of Consciousness: awake and alert   Airway & Oxygen Therapy: Patient Spontanous Breathing and Patient connected to face mask oxygen  Post-op Assessment: Report given to RN and Post -op Vital signs reviewed and stable  Post vital signs: Reviewed and stable  Last Vitals:  Vitals Value Taken Time  BP 143/80 05/17/23 1835  Temp    Pulse 70 05/17/23 1837  Resp 14 05/17/23 1837  SpO2 98 % 05/17/23 1837  Vitals shown include unfiled device data.  Last Pain:  Vitals:   05/17/23 1406  TempSrc:   PainSc: 0-No pain         Complications: No notable events documented.

## 2023-05-17 NOTE — Interval H&P Note (Signed)
History and Physical Interval Note:  05/17/2023 2:49 PM  [name]  has presented today for surgery, with the diagnosis of OVARIAN MASS, THICKENED ENDOMETRIUM.  The various methods of treatment have been discussed with the patient and family. After consideration of risks, benefits and other options for treatment, the patient has consented to  Procedure(s): DILATATION AND CURETTAGE (N/A) XI ROBOTIC ASSISTED TOTAL HYSTERECTOMY WITH BILATERAL SALPINGO OOPHORECTOMY,POSSIBLE STAGING (Bilateral) XI ROBOTIC ASSISTED LAPAROSCOPIC SACROCOLPOPEXY (N/A) as a surgical intervention.  The patient's history has been reviewed, patient examined, no change in status, stable for surgery.  I have reviewed the patient's chart and labs.  Questions were answered to the patient's satisfaction.     Carver Fila

## 2023-05-17 NOTE — Anesthesia Preprocedure Evaluation (Addendum)
Anesthesia Evaluation  Patient identified by MRN, date of birth, ID band Patient awake    Reviewed: Allergy & Precautions, NPO status , Patient's Chart, lab work & pertinent test results  History of Anesthesia Complications Negative for: history of anesthetic complications  Airway Mallampati: I  TM Distance: >3 FB Neck ROM: Full    Dental  (+) Caps, Dental Advisory Given   Pulmonary neg pulmonary ROS   breath sounds clear to auscultation       Cardiovascular (-) angina  Rhythm:Regular Rate:Normal     Neuro/Psych negative neurological ROS     GI/Hepatic Neg liver ROS,GERD  Controlled,,  Endo/Other  BMI 32.8  Renal/GU negative Renal ROS     Musculoskeletal   Abdominal   Peds  Hematology   Anesthesia Other Findings   Reproductive/Obstetrics                             Anesthesia Physical Anesthesia Plan  ASA: 2  Anesthesia Plan: General   Post-op Pain Management: Tylenol PO (pre-op)*   Induction: Intravenous  PONV Risk Score and Plan: 3 and Ondansetron and Dexamethasone  Airway Management Planned: Oral ETT  Additional Equipment: None  Intra-op Plan:   Post-operative Plan: Extubation in OR  Informed Consent: I have reviewed the patients History and Physical, chart, labs and discussed the procedure including the risks, benefits and alternatives for the proposed anesthesia with the patient or authorized representative who has indicated his/her understanding and acceptance.     Dental advisory given  Plan Discussed with: CRNA and Surgeon  Anesthesia Plan Comments:         Anesthesia Quick Evaluation

## 2023-05-17 NOTE — Op Note (Addendum)
OPERATIVE NOTE  Pre-operative Diagnosis: Complex adnexal mass, thickened endometrium, pelvic organ prolapse  Post-operative Diagnosis: same  Operation: Robotic-assisted laparoscopic total hysterectomy with bilateral salpingo-oophorectomy Robotic sacrocolpopexy Marlou Porch)  Surgeon: Eugene Garnet MD  Assistant Surgeon: Warner Mccreedy, NP (an NP assistant was necessary for tissue manipulation, management of robotic instrumentation, retraction and positioning due to the complexity of the case and hospital policies).   Anesthesia: GET  Urine Output: 150 cc total for all proceudres  Operative Findings:  : On EUA, small mobile uterus.  On intra-abdominal entry, normal upper abdominal survey.  Some adhesions of the omentum to the anterior abdominal wall in the mid abdomen.  Normal-appearing omentum, small and large bowel otherwise.  Some adhesions noted of the cecum and ascending colon to the right lateral abdominal wall.  Uterus approximately 8-10 cm, normal in appearance.  Right ovary with an approximately 3 cm solid-appearing ovarian mass, most consistent with a fibroma.  Left tube and ovary normal appearance.  No ascites. Frozen section, endometrial biopsy showed some hyperplasia, no definitive atypia.  Frozen section of the endometrium after hysterectomy revealed a benign polyp.  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  Estimated Blood Loss:  150 cc for all procedures      Total IV Fluids: see I&O flowsheet         Specimens: uterus, cervix, bilateral tubes and ovaries, pelvic washings         Complications:  None apparent; patient tolerated the procedure well.         Disposition: PACU - hemodynamically stable.  Procedure Details  The patient was seen in the Holding Room. The risks, benefits, complications, treatment options, and expected outcomes were discussed with the patient.  The patient concurred with the proposed plan, giving  informed consent.  The site of surgery properly noted/marked. The patient was identified as [name] and the procedure verified as a Robotic-assisted hysterectomy with bilateral salpingo oophorectomy.   After induction of anesthesia, the patient was draped and prepped in the usual sterile manner. Patient was placed in supine position after anesthesia and draped and prepped in the usual sterile manner as follows: Her arms were tucked to her side with all appropriate precautions.  The patient was secured to the bed using padding and tape across her chest.  The patient was placed in the semi-lithotomy position in [place] stirrups.  The perineum and vagina were prepped with CHG. The patient's abdomen was prepped with ChloraPrep and then she was draped after the prep had been allowed to dry for 3 minutes.  A Time Out was held and the above information confirmed.  The urethra was prepped with Betadine. Foley catheter was placed.  A sterile speculum was placed in the vagina.  The cervix was grasped with a single-tooth tenaculum.  An endometrial Pipelle was used to obtain an endometrial biopsy with 2 passes performed.  This was placed in a container and sent for frozen section.  The cervix was dilated with Shawnie Pons dilators.  A small episiotomy was cut with Mayo scissors.  The 3.5 ZUMI uterine manipulator with a medium colpotomizer ring was placed without difficulty.  A pneum occluder balloon was placed over the manipulator.  OG tube placement was confirmed and to suction.   Next, a 10 mm skin incision was made 1 cm below the subcostal margin in the midclavicular line.  The 5 mm Optiview port and scope was used for direct entry.  Opening pressure was under  10 mm CO2.  The abdomen was insufflated and the findings were noted as above.   At this point and all points during the procedure, the patient's intra-abdominal pressure did not exceed 15 mmHg.  Next, an 8 mm incision was made in the left mid abdomen.  Sharp  dissection using scissors was then used to lyse adhesions of the omentum to the anterior abdominal wall until the omentum was freed and the mid abdomen.  In the pelvis on the left, monopolar scissors with short burst of electrocautery were used to transect the remaining omental adhesion.  An 8 mm skin incision was made superior to the umbilicus as was an incision in the right mid abdomen.  Both trocars were placed under direct visualization.  Marking was performed for A fourth arm was placed on the right, was later placed by Dr. Marlou Porch after my portion of the procedure was finished.  The 5 mm assist trocar was exchanged for a 12 mm airseal port. All ports were placed under direct visualization.  The patient was placed in steep Trendelenburg.  Bowel was folded away into the upper abdomen.  The robot was docked in the normal manner.  The right and left peritoneum were opened parallel to the IP ligament to open the retroperitoneal spaces bilaterally. The round ligaments were transected. The ureter was noted to be on the medial leaf of the broad ligament.  The peritoneum above the ureter was incised and stretched and the infundibulopelvic ligament was skeletonized, cauterized and cut.    The posterior peritoneum was taken down to the level of the KOH ring.  The anterior peritoneum was also taken down.  The bladder flap was created to the level of the KOH ring.  The uterine artery on the right side was skeletonized, cauterized and cut in the normal manner.  A similar procedure was performed on the left.  The colpotomy was made and the uterus, cervix, bilateral ovaries and tubes were amputated and delivered through the vagina.  The specimen was sent for frozen section.  Pedicles were inspected and excellent hemostasis was achieved.    The colpotomy at the vaginal cuff was closed with 0 Vicryl with a figure-of-eight at each apex and 0 strata fix to close the midportion of the cuff in 2 layers in a running manner.   Irrigation was used and excellent hemostasis was achieved.    Frozen section returned showing benign endometrium and right ovarian lesion.  Attention was then turned to lysing the adhesions of the cecum the right abdominal sidewall.  This was done sharply.  I watched as Dr. Marlou Porch placed the right lateral assist robotic port.  The case was then turned over to him.    Dr. Marlou Porch had finished, the pelvis was irrigated with hemostasis noted..  Robotic instruments were removed under direct visulaization.  The robot was undocked. The fascia at the 10-12 mm port was closed with 0 Vicryl on a UR-5 needle.  The subcuticular tissue was closed with 4-0 Vicryl and the skin was closed with 4-0 Monocryl in a subcuticular manner.  Dermabond was applied.    The vagina was packed with packing and Estrace per Dr. Marlou Porch.  All sponge, lap and needle counts were correct x  3.   The patient was transferred to the recovery room in stable condition.  Eugene Garnet, MD

## 2023-05-17 NOTE — H&P (Signed)
Pelvic organ prolapse   Patient presents today for evaluation of her pelvic organ prolapse. She has had symptoms of prolapse for about a year. She initially discovered her prolapse when she was treated for an infection in the emergency department. That was her last infection. She does feel as if she empties her bladder fairly well, but does have significant urinary urgency and occasional urge incontinence. She has no leakage with coughing, laughing, or sneezing. She is not having a lot of pain.   As part of the patient's evaluation of her UTI she had a CT scan. She had a lesion that was incidentally found on her ovary as well as her endometrium. She also had a small right renal lesion, but ultimately it was determined to be a cyst.   The patient is currently scheduled for hysterectomy and bilateral salpingo-oophorectomy. As part of the surgery, Dr. Pricilla Holm, has asked me to evaluate the patient for prolapse so that we can perhaps do a concomitant case.     ALLERGIES: No Known Allergies    MEDICATIONS: None   GU PSH: No GU PSH    NON-GU PSH: No Non-GU PSH    GU PMH: No GU PMH    NON-GU PMH: No Non-GU PMH    FAMILY HISTORY: No Family History    SOCIAL HISTORY: No Social History    REVIEW OF SYSTEMS:    GU Review Female:   Patient denies frequent urination, hard to postpone urination, burning /pain with urination, get up at night to urinate, leakage of urine, stream starts and stops, trouble starting your stream, have to strain to urinate, and being pregnant.  Gastrointestinal (Upper):   Patient denies nausea, vomiting, and indigestion/ heartburn.  Gastrointestinal (Lower):   Patient denies diarrhea and constipation.  Constitutional:   Patient denies fever, night sweats, weight loss, and fatigue.  Skin:   Patient denies skin rash/ lesion and itching.  Eyes:   Patient denies blurred vision and double vision.  Ears/ Nose/ Throat:   Patient denies sore throat and sinus problems.   Hematologic/Lymphatic:   Patient denies swollen glands and easy bruising.  Cardiovascular:   Patient denies leg swelling and chest pains.  Respiratory:   Patient denies cough and shortness of breath.  Endocrine:   Patient denies excessive thirst.  Musculoskeletal:   Patient denies joint pain and back pain.  Neurological:   Patient denies headaches and dizziness.  Psychologic:   Patient denies depression and anxiety.   VITAL SIGNS:      05/15/2023 11:50 AM  Weight 192 lb / 87.09 kg  Height 162 in / 411.48 cm  BP 151/89 mmHg  Pulse 80 /min  Temperature 96.9 F / 36.0 C  BMI 5.1 kg/m   GU PHYSICAL EXAMINATION:    External Genitalia: No hirsutism, no rash, no scarring, no cyst, no erythematous lesion, no papular lesion, no blanched lesion, no warty lesion. No edema.  Urethral Meatus: Normal size. Normal position. No discharge.  Urethra: No tenderness, no mass, no scarring. No hypermobility. No leakage.  Bladder: Normal to palpation, no tenderness, no mass, normal size.  Vagina: Mild vaginal atrophy. Large cystocele. No stenosis. No rectocele. No enterocele.   Anus and Perineum: No hemorrhoids. No anal stenosis. No rectal fissure, no anal fissure. No edema, no dimple, no perineal tenderness, no anal tenderness.   MULTI-SYSTEM PHYSICAL EXAMINATION:    Constitutional: Well-nourished. No physical deformities. Normally developed. Good grooming.  Neck: Neck symmetrical, not swollen. Normal tracheal position.  Respiratory: No labored  breathing, no use of accessory muscles.   Cardiovascular: Normal temperature, normal extremity pulses, no swelling, no varicosities.  Lymphatic: No enlargement of neck, axillae, groin.  Skin: No paleness, no jaundice, no cyanosis. No lesion, no ulcer, no rash.  Neurologic / Psychiatric: Oriented to time, oriented to place, oriented to person. No depression, no anxiety, no agitation.  Gastrointestinal: No mass, no tenderness, no rigidity, non obese abdomen.   Eyes: Normal conjunctivae. Normal eyelids.  Ears, Nose, Mouth, and Throat: Left ear no scars, no lesions, no masses. Right ear no scars, no lesions, no masses. Nose no scars, no lesions, no masses. Normal hearing. Normal lips.  Musculoskeletal: Normal gait and station of head and neck.     Complexity of Data:  Source Of History:  Patient  Records Review:   Previous Doctor Records, Previous Patient Records, POC Tool  Urine Test Review:   Urinalysis  X-Ray Review: MRI Abdomen: Reviewed Films. Discussed With Patient.     PROCEDURES: None   ASSESSMENT:      ICD-10 Details  1 GU:   Cystocele, midline - N81.11    PLAN:           Document Letter(s):  Created for Patient: Clinical Summary         Notes:   I went over robotic-assisted laparoscopic sacral colpopexy with the patient detail. I explained to the patient the rationale for the surgery. I also went over the placement of the laparoscopic ports. I detailed to her the surgery as well as the postoperative recovery time. I explained to the patient that she could expect to be in the hospital at least one or 2 nights. She will require 4 weeks of no heavy lifting, 6 weeks of no bending or twisting. She will not be able to use her vagina for 6 weeks. I discussed complications of the operation including injury to bowel, ureters, bladder. We also discussed the risk of failure as well as the complications of mesh. I explained to them the difference between transvaginal mesh and the mesh used for sacral colpopexy. I reassured them that there has not been an FDA warnings in regards to sacral colpopexy mesh. We will plan to get this prior to her surgery. I spent 45 minutes with the patient going over that ins and outs of the surgery and answering all her questions.   Plan will be to follow Dr. Pricilla Holm in the operating room this coming Wednesday.

## 2023-05-17 NOTE — Interval H&P Note (Signed)
History and Physical Interval Note:  05/17/2023 2:09 PM  [name]  has presented today for surgery, with the diagnosis of OVARIAN MASS, THICKENED ENDOMETRIUM.  The various methods of treatment have been discussed with the patient and family. After consideration of risks, benefits and other options for treatment, the patient has consented to  Procedure(s): DILATATION AND CURETTAGE (N/A) XI ROBOTIC ASSISTED TOTAL HYSTERECTOMY WITH BILATERAL SALPINGO OOPHORECTOMY,POSSIBLE STAGING (Bilateral) XI ROBOTIC ASSISTED LAPAROSCOPIC SACROCOLPOPEXY (N/A) as a surgical intervention.  The patient's history has been reviewed, patient examined, no change in status, stable for surgery.  I have reviewed the patient's chart and labs.  Questions were answered to the patient's satisfaction.     Crist Fat

## 2023-05-17 NOTE — Anesthesia Postprocedure Evaluation (Signed)
Anesthesia Post Note  Patient: AHNI BRADWELL  Procedure(s) Performed: DILATATION AND CURETTAGE XI ROBOTIC ASSISTED TOTAL HYSTERECTOMY WITH BILATERAL SALPINGO OOPHORECTOMY,POSSIBLE STAGING (Bilateral) XI ROBOTIC ASSISTED LAPAROSCOPIC SACROCOLPOPEXY     Patient location during evaluation: PACU Anesthesia Type: General Level of consciousness: awake and alert Pain management: pain level controlled Vital Signs Assessment: post-procedure vital signs reviewed and stable Respiratory status: spontaneous breathing, nonlabored ventilation, respiratory function stable and patient connected to nasal cannula oxygen Cardiovascular status: blood pressure returned to baseline and stable Anesthetic complications: no   No notable events documented.  Last Vitals:  Vitals:   05/17/23 1845 05/17/23 1900  BP: 137/72 132/66  Pulse: 64 64  Resp: 14 16  Temp:    SpO2: 97% 100%    Last Pain:  Vitals:   05/17/23 1900  TempSrc:   PainSc: Asleep                 Larysa Pall A.

## 2023-05-18 ENCOUNTER — Ambulatory Visit: Payer: Medicare Other | Admitting: Obstetrics

## 2023-05-18 ENCOUNTER — Encounter (HOSPITAL_COMMUNITY): Payer: Self-pay | Admitting: Gynecologic Oncology

## 2023-05-18 DIAGNOSIS — N8111 Cystocele, midline: Secondary | ICD-10-CM | POA: Diagnosis not present

## 2023-05-18 DIAGNOSIS — R9389 Abnormal findings on diagnostic imaging of other specified body structures: Secondary | ICD-10-CM | POA: Diagnosis not present

## 2023-05-18 DIAGNOSIS — N8500 Endometrial hyperplasia, unspecified: Secondary | ICD-10-CM | POA: Diagnosis not present

## 2023-05-18 MED ORDER — SCOPOLAMINE 1 MG/3DAYS TD PT72
1.0000 | MEDICATED_PATCH | TRANSDERMAL | Status: DC
  Administered 2023-05-18: 1.5 mg via TRANSDERMAL
  Filled 2023-05-18: qty 1

## 2023-05-18 MED ORDER — LACTATED RINGERS IV BOLUS
1000.0000 mL | Freq: Once | INTRAVENOUS | Status: AC
  Administered 2023-05-18: 1000 mL via INTRAVENOUS

## 2023-05-18 MED ORDER — ONDANSETRON HCL 4 MG/2ML IJ SOLN
4.0000 mg | Freq: Four times a day (QID) | INTRAMUSCULAR | Status: DC | PRN
  Administered 2023-05-18: 4 mg via INTRAVENOUS
  Filled 2023-05-18: qty 2

## 2023-05-18 NOTE — TOC CM/SW Note (Signed)
Transition of Care [facility]) - Inpatient Brief Assessment   Patient Details  Name: [name] MRN: [id number] Date of Birth: 1936-05-11  Transition of [facility]) CM/SW Contact:    Darleene Cleaver, LCSW Phone Number: 05/18/2023, 12:19 PM   Clinical Narrative: Patient does not have any SDOH needs, and lives with her husband.  TOC signing off.   Transition of Care Asessment: Insurance and Status: Insurance coverage has been reviewed Patient has primary care physician: Yes Home environment has been reviewed: Yes lives with husband Prior level of function:: Radio broadcast assistant Home Services: No current home services Social Determinants of Health Reivew: SDOH reviewed no interventions necessary Readmission risk has been reviewed: Yes Transition of care needs: no transition of care needs at this time

## 2023-05-18 NOTE — Discharge Summary (Signed)
Date of admission: 05/17/2023  Date of discharge: 05/18/2023  Admission diagnosis: pelvic organ prolapse  Discharge diagnosis: same  Secondary diagnoses:  Patient Active Problem List   Diagnosis Date Noted   Ovarian mass 05/17/2023   Thickened endometrium 05/17/2023   Pelvic organ prolapse quantification stage 3 cystocele 05/17/2023   Prolapse of female pelvic organs 05/17/2023   Primary osteoarthritis of right knee 08/15/2018   Right knee DJD 08/15/2018    Procedures performed: Procedure(s): DILATATION AND CURETTAGE XI ROBOTIC ASSISTED TOTAL HYSTERECTOMY WITH BILATERAL SALPINGO OOPHORECTOMY,POSSIBLE STAGING XI ROBOTIC ASSISTED LAPAROSCOPIC SACROCOLPOPEXY  History and Physical: For full details, please see admission history and physical. Briefly, [name] is a 87 y.o. year old patient with pelvic organ prolapse.   Hospital Course: Patient tolerated the procedure well.  She was then transferred to the floor after an uneventful PACU stay.  Her hospital course was uncomplicated.  On POD#1 she had met discharge criteria: was eating a regular diet, was up and ambulating independently,  pain was well controlled, was voiding without a catheter, and was ready to for discharge.   PE on day of discharge: NAD Vitals:   05/17/23 2231 05/17/23 2320 05/18/23 0206 05/18/23 0551  BP: (!) 140/69 (!) 145/77 132/66 (!) 148/85  Pulse: 71 76 78 83  Resp: 16 15 15 15   Temp: 97.7 F (36.5 C) 97.6 F (36.4 C) 97.8 F (36.6 C) 98 F (36.7 C)  TempSrc: Oral Oral Oral Oral  SpO2: 96% 96% 93% 97%  Weight:        Intake/Output Summary (Last 24 hours) at 05/18/2023 0744 Last data filed at 05/18/2023 0600 Gross per 24 hour  Intake 1700 ml  Output 1450 ml  Net 250 ml   Non--labored breathing Abdomen is soft, incisions are c/d/I Vaginal packing and foley removed Ext symmetric   Laboratory values:  Recent Labs    05/15/23 1003  WBC 5.4  HGB 14.2  HCT 44.3   Recent Labs     05/15/23 1003  NA 140  K 4.1  CL 105  CO2 25  GLUCOSE 103*  BUN 20  CREATININE 0.79  CALCIUM 9.5   No results for input(s): "LABPT", "INR" in the last 72 hours. No results for input(s): "LABURIN" in the last 72 hours. Results for orders placed or performed during the hospital encounter of 07/26/22  Urine Culture     Status: None   Collection Time: 07/26/22  7:18 AM   Specimen: Urine, Clean Catch  Result Value Ref Range Status   Specimen Description URINE, CLEAN CATCH  Final   Special Requests NONE  Final   Culture   Final    NO GROWTH Performed at [facility], [address], [place], [place] [address]    Report Status 07/28/2022 FINAL  Final    Disposition: Home  Discharge instruction: The patient was instructed to be ambulatory but told to refrain from heavy lifting, strenuous activity, or driving.   Discharge medications:  Allergies as of 05/18/2023       Reactions   Hydromorphone    Other Reaction(s): vomiting        Medication List     TAKE these medications    estradiol 0.1 MG/GM vaginal cream Commonly known as: ESTRACE VAGINAL Place 1 Applicatorful vaginally 3 (three) times a week. Use 1 small dolyp of cream on tip of index finger and swap the inside of the vagina   senna-docusate 8.6-50 MG tablet Commonly known as: Senokot-S Take  2 tablets by mouth at bedtime. For AFTER surgery, do not take if having diarrhea   sulfamethoxazole-trimethoprim 800-160 MG tablet Commonly known as: BACTRIM DS Take 1 tablet by mouth 2 (two) times daily.   traMADol 50 MG tablet Commonly known as: ULTRAM Take 1 tablet (50 mg total) by mouth every 6 (six) hours as needed for severe pain. For AFTER surgery only, do not take and drive   Vitamin D-3 161 MCG (5000 UT) Tabs Take 5,000 Units by mouth 3 (three) times a week.        Followup:   Follow-up Information     Carver Fila, MD Follow up on 05/25/2023.   Specialty: Gynecologic Oncology Why:  around 4:30pm will be a PHONE visit with Dr. Pricilla Holm to check in and discuss final path. IN PERSON visit will be on 06/16/23 at 8 am at the [facility] information: [address] [place] [address] [phone]         Vanessa Barbara, NP Follow up on 06/01/2023.   Why: 2pm Contact information: [address] [place] [address] [phone]

## 2023-05-18 NOTE — Progress Notes (Signed)
1 Day Post-Op Procedure(s) (LRB): DILATATION AND CURETTAGE (N/A) XI ROBOTIC ASSISTED TOTAL HYSTERECTOMY WITH BILATERAL SALPINGO OOPHORECTOMY,POSSIBLE STAGING (Bilateral) XI ROBOTIC ASSISTED LAPAROSCOPIC SACROCOLPOPEXY (N/A)  Subjective: Patient reports doing well. No pain reported. She is due to void since foley removal. Tolerating diet. Walked in halls last pm and tolerated this well. Had some weakness which she coorelates with no having food in her stomach. No flatus. Denies chest pain, dyspnea. No concerns voiced.    Objective: Vital signs in last 24 hours: Temp:  [97.5 F (36.4 C)-98 F (36.7 C)] 98 F (36.7 C) (10/17 0551) Pulse Rate:  [61-86] 83 (10/17 0551) Resp:  [10-21] 15 (10/17 0551) BP: (103-148)/(63-85) 148/85 (10/17 0551) SpO2:  [93 %-100 %] 97 % (10/17 0551) Weight:  [191 lb (86.6 kg)] 191 lb (86.6 kg) (10/16 1406) Last BM Date : 05/17/23  Intake/Output from previous day: 10/16 0701 - 10/17 0700 In: 1700 [P.O.:300; I.V.:1100; IV Piggyback:300] Out: 1450 [Urine:1300; Blood:150]  Physical Examination: General: alert, cooperative, and no distress Resp: clear to auscultation bilaterally Cardio: regular rate and rhythm, S1, S2 normal, no murmur, click, rub or gallop GI: incision: lap site incisions to the abdomen with dermabond intact with no active drainage and abdomen soft, hypoactive bowel sounds Extremities: extremities normal, atraumatic, no cyanosis or edema  Labs: None  Assessment: 87 y.o. s/p Procedure(s): DILATATION AND CURETTAGE XI ROBOTIC ASSISTED TOTAL HYSTERECTOMY WITH BILATERAL SALPINGO-OOPHORECTOMY,POSSIBLE STAGING, XI ROBOTIC ASSISTED LAPAROSCOPIC SACROCOLPOPEXY: stable Pain:  Pain is well-controlled on PRN medications.  CV: BP and HR stable. Continue to monitor while inpt.  GI:  Tolerating po: yes. Antiemetics ordered as needed.  GU: Due to void since foley removal.    Prophylaxis: SCDs  Plan: Continue plan per Dr. Marlou Porch Due to  void Per pt, can be discharged today if able to void   LOS: 0 days    [name] 05/18/2023, 8:23 AM

## 2023-05-18 NOTE — Progress Notes (Signed)
Vaginal packing removed per doctor's order. About 70% saturation noted.

## 2023-05-18 NOTE — Progress Notes (Signed)
Pt was discharged home today. Instructions were reviewed with patient, and questions were answered. Pt was taken to main entrance via wheelchair by NT.  

## 2023-05-18 NOTE — Plan of Care (Signed)
  Problem: Education: Goal: Knowledge of General Education information will improve Description: Including pain rating scale, medication(s)/side effects and non-pharmacologic comfort measures Outcome: Adequate for Discharge   Problem: Health Behavior/Discharge Planning: Goal: Ability to manage health-related needs will improve Outcome: Adequate for Discharge   Problem: Clinical Measurements: Goal: Ability to maintain clinical measurements within normal limits will improve Outcome: Adequate for Discharge Goal: Will remain free from infection Outcome: Adequate for Discharge Goal: Diagnostic test results will improve Outcome: Adequate for Discharge Goal: Respiratory complications will improve Outcome: Adequate for Discharge Goal: Cardiovascular complication will be avoided Outcome: Adequate for Discharge   Problem: Activity: Goal: Risk for activity intolerance will decrease Outcome: Adequate for Discharge   Problem: Nutrition: Goal: Adequate nutrition will be maintained Outcome: Adequate for Discharge   Problem: Coping: Goal: Level of anxiety will decrease Outcome: Adequate for Discharge   Problem: Elimination: Goal: Will not experience complications related to bowel motility Outcome: Adequate for Discharge Goal: Will not experience complications related to urinary retention Outcome: Adequate for Discharge   Problem: Pain Managment: Goal: General experience of comfort will improve Outcome: Adequate for Discharge   Problem: Safety: Goal: Ability to remain free from injury will improve Outcome: Adequate for Discharge   Problem: Skin Integrity: Goal: Risk for impaired skin integrity will decrease Outcome: Adequate for Discharge   Problem: Education: Goal: Understanding of post-operative needs will improve Outcome: Adequate for Discharge Goal: Individualized Educational Video(s) Outcome: Adequate for Discharge   Problem: Clinical Measurements: Goal: Postoperative  complications will be avoided or minimized Outcome: Adequate for Discharge   Problem: Respiratory: Goal: Will regain and/or maintain adequate ventilation Outcome: Adequate for Discharge

## 2023-05-18 NOTE — Care Management Obs Status (Signed)
MEDICARE OBSERVATION STATUS NOTIFICATION   Patient Details  Name: [name] MRN: [id number] Date of Birth: 1935/12/11   Medicare Observation Status Notification Given:  Yes    Darleene Cleaver, LCSW 05/18/2023, 12:15 PM

## 2023-05-19 ENCOUNTER — Telehealth: Payer: Self-pay | Admitting: *Deleted

## 2023-05-19 NOTE — Telephone Encounter (Signed)
Spoke with [name] this morning. She states she is eating, drinking and urinating well. She has not had a BM yet but is passing gas. She is taking senokot as prescribed and encouraged her to drink plenty of water. She denies fever or chills. Incisions are dry and intact. She rates her pain 5/10. Her pain is controlled with ibuprofen.    Instructed to call office with any fever, chills, purulent drainage, uncontrolled pain or any other questions or concerns. Patient verbalizes understanding.   Pt aware of post op appointments as well as the office number [phone] and after hours number [phone] to call if she has any questions or concerns

## 2023-05-22 LAB — CYTOLOGY - NON PAP

## 2023-05-23 ENCOUNTER — Telehealth: Payer: Self-pay

## 2023-05-23 ENCOUNTER — Telehealth: Payer: Self-pay | Admitting: Gynecologic Oncology

## 2023-05-23 LAB — SURGICAL PATHOLOGY

## 2023-05-23 NOTE — Telephone Encounter (Signed)
Phone visit scheduled for Thursday 05/25/23 with Dr. Pricilla Holm canceled d/t Dr. Pricilla Holm called and spoke to her today.

## 2023-05-23 NOTE — Telephone Encounter (Signed)
Called patient. She is doing well after surgery. Denies any vaginal bleeding after first few days.  Reviewed pathology from surgery - she is very happy with this news.  Eugene Garnet MD Gynecologic Oncology

## 2023-05-24 NOTE — Plan of Care (Signed)
[facility] Tonsillectomy/Adenoidectomy, Postoperative PEDS care plan entered in error.

## 2023-05-25 ENCOUNTER — Inpatient Hospital Stay: Payer: Medicare Other | Admitting: Gynecologic Oncology

## 2023-05-29 IMAGING — MG DIGITAL SCREENING BREAST BILAT IMPLANT W/ TOMO W/ CAD
8 of 12 series · 8 of 28 positions shown · non-contrast
Comparison: Previous exam(s).

CLINICAL DATA: Screening.

EXAM:
DIGITAL SCREENING BILATERAL MAMMOGRAM WITH IMPLANTS, CAD AND
TOMOSYNTHESIS
TECHNIQUE: Bilateral screening digital craniocaudal and mediolateral oblique
mammograms were obtained. Bilateral screening digital breast
tomosynthesis was performed. The images were evaluated with
computer-aided detection. Standard and/or implant displaced views
were performed.

[L MLO]
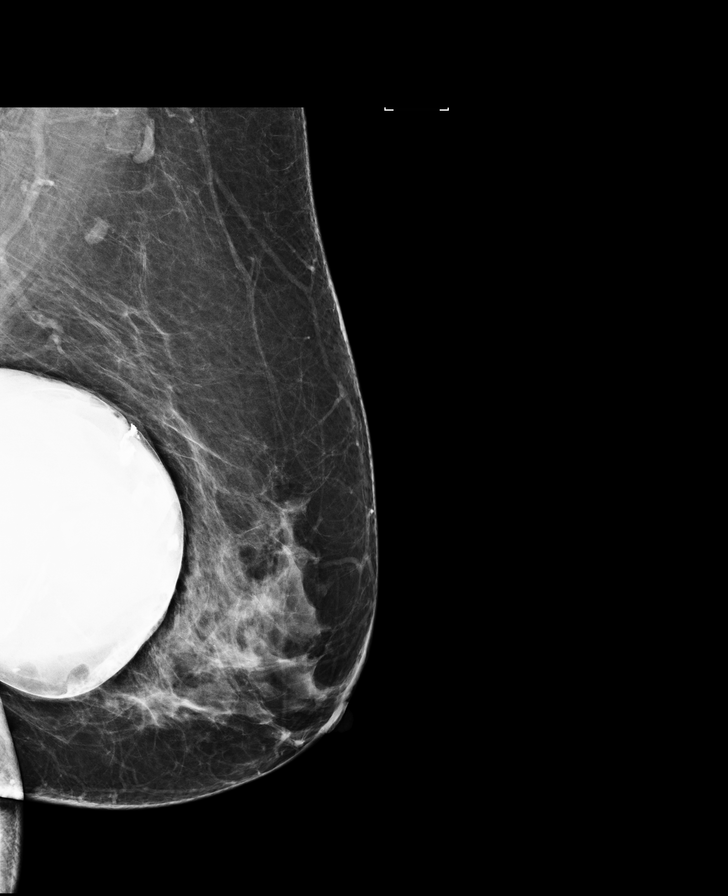

[R CC]
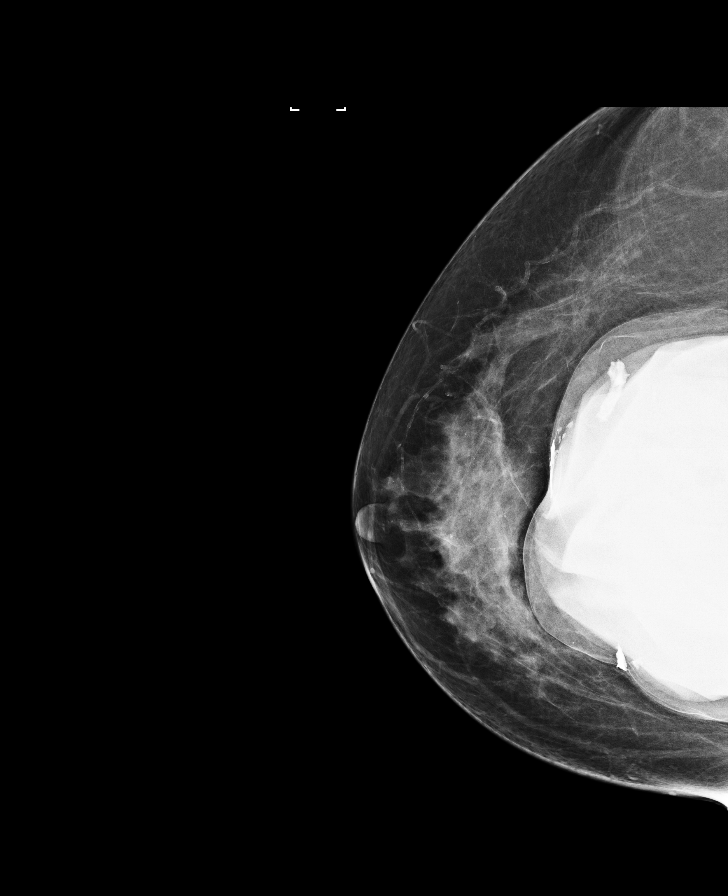

[R MLO]
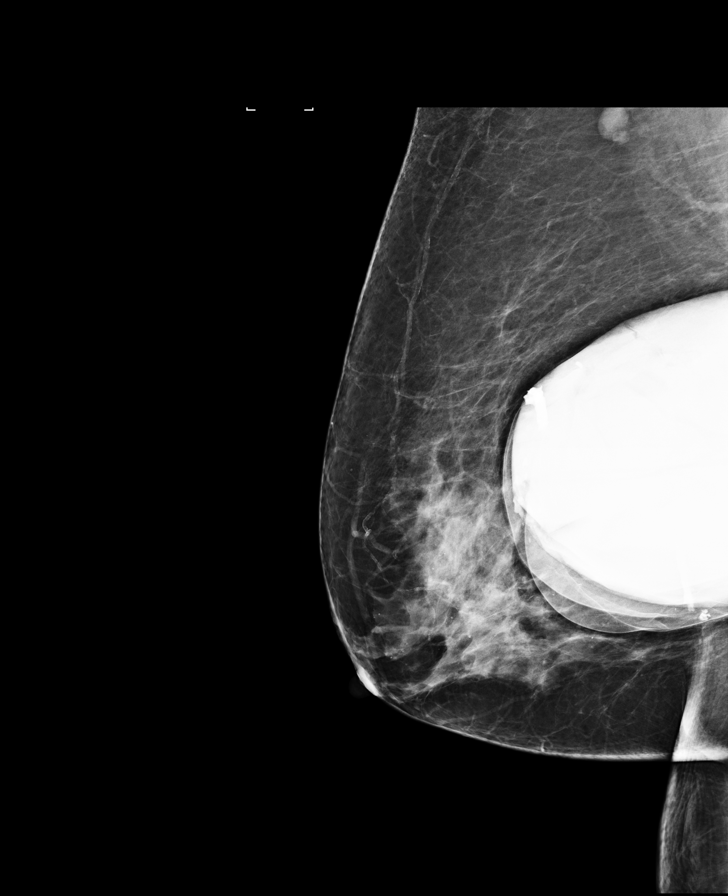

[L CC]
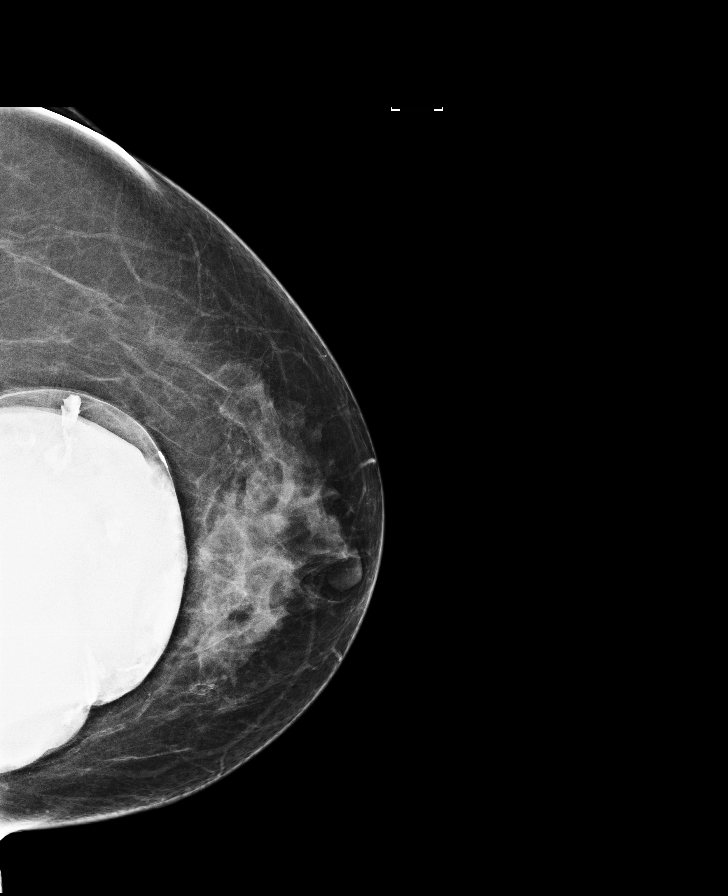

[R CC synth-2D]
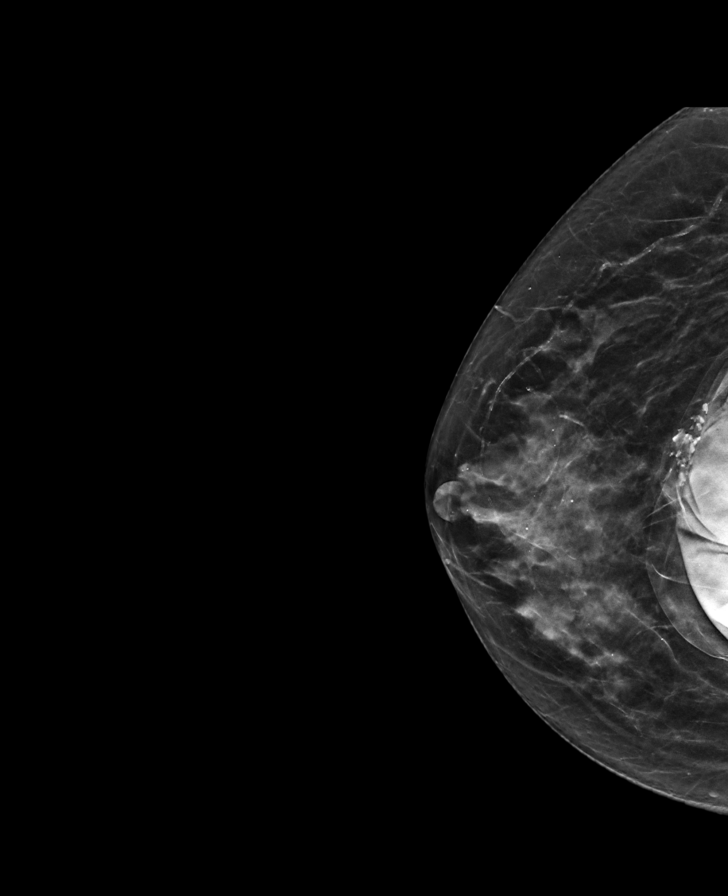

[R MLO synth-2D]
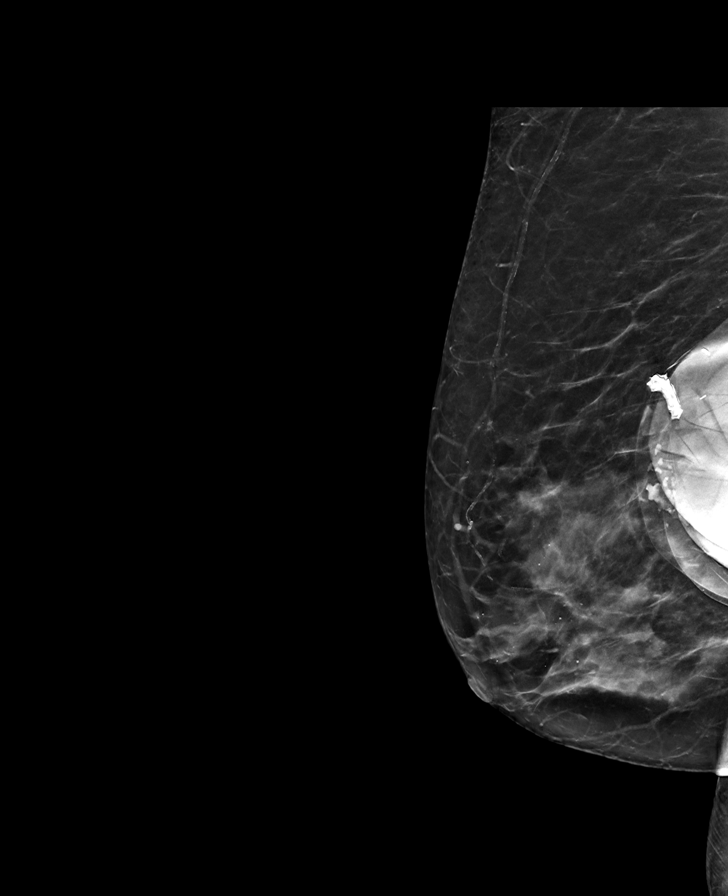

[L CC synth-2D]
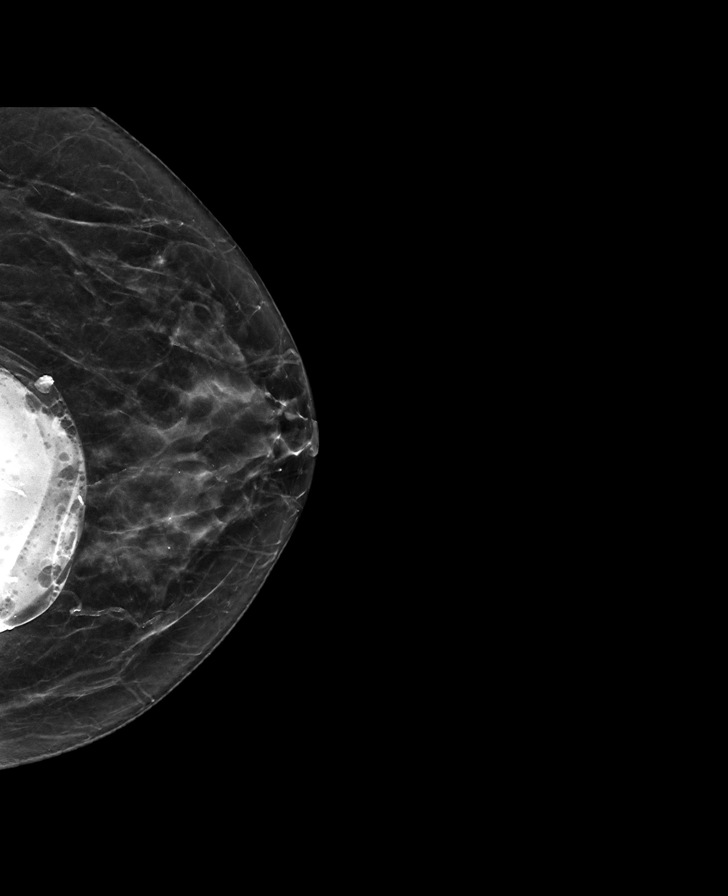

[L MLO synth-2D]
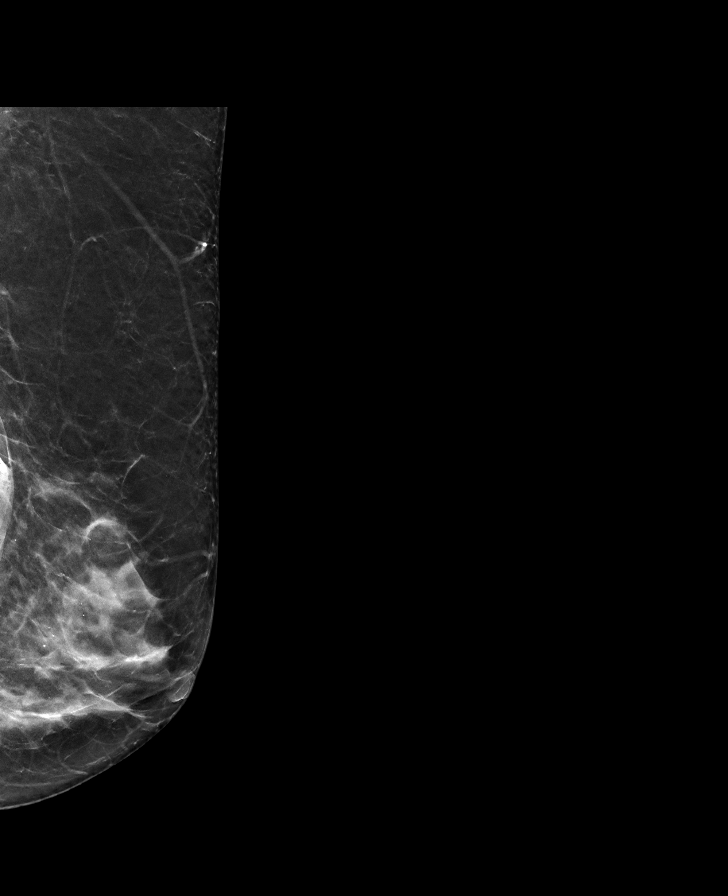

[8 of 28 positions shown; findings below may reference images not displayed]

ACR Breast Density Category c: The breast tissue is heterogeneously
dense, which may obscure small masses.
FINDINGS: The patient has retropectoral implants. There are no findings
suspicious for malignancy.
IMPRESSION: No mammographic evidence of malignancy. A result letter of this
screening mammogram will be mailed directly to the patient.

RECOMMENDATION:
Screening mammogram in one year. (Code:LT-E-7TH)

BI-RADS CATEGORY  1:  Negative.

## 2023-06-01 DIAGNOSIS — N8111 Cystocele, midline: Secondary | ICD-10-CM | POA: Diagnosis not present

## 2023-06-15 ENCOUNTER — Inpatient Hospital Stay: Payer: Medicare Other | Attending: Gynecologic Oncology | Admitting: Gynecologic Oncology

## 2023-06-15 ENCOUNTER — Encounter: Payer: Self-pay | Admitting: Gynecologic Oncology

## 2023-06-15 VITALS — BP 138/78 | HR 83 | Temp 98.8°F | Resp 20 | Wt 197.2 lb

## 2023-06-15 DIAGNOSIS — N838 Other noninflammatory disorders of ovary, fallopian tube and broad ligament: Secondary | ICD-10-CM

## 2023-06-15 DIAGNOSIS — Z90710 Acquired absence of both cervix and uterus: Secondary | ICD-10-CM

## 2023-06-15 DIAGNOSIS — Z90722 Acquired absence of ovaries, bilateral: Secondary | ICD-10-CM

## 2023-06-15 DIAGNOSIS — Z9889 Other specified postprocedural states: Secondary | ICD-10-CM

## 2023-06-15 DIAGNOSIS — R9389 Abnormal findings on diagnostic imaging of other specified body structures: Secondary | ICD-10-CM

## 2023-06-15 DIAGNOSIS — N819 Female genital prolapse, unspecified: Secondary | ICD-10-CM

## 2023-06-15 NOTE — Patient Instructions (Signed)
It was great to see you today.  You are healing very well from surgery!  Please remember, no heavy lifting for 6 weeks after surgery and nothing in the vagina for at least 10-12.  Please do not hesitate to contact the office if you need anything in the future at [phone].

## 2023-06-15 NOTE — Progress Notes (Signed)
Gynecologic Oncology Return Clinic Visit  06/15/23  Reason for Visit: follow-up  Treatment History: 05/17/23: Robotic-assisted laparoscopic total hysterectomy with bilateral salpingo-oophorectomy Robotic sacrocolpopexy Marlou Porch)  Interval History: Doing well.  Denies any abdominal or pelvic pain.  Reports improvement in bowel and bladder function since prior to surgery.  Denies any vaginal bleeding or discharge.  Past Medical/Surgical History: Past Medical History:  Diagnosis Date   Arthritis    Chronic kidney disease    Diverticulosis    Elevated blood pressure reading 08/10/2018   GERD (gastroesophageal reflux disease)    Hypertension    Sinus drainage    chronic ; "just makes me cough"     Past Surgical History:  Procedure Laterality Date   BILATERAL KNEE ARTHROSCOPY     cataract surgery  Bilateral severa; years ago   COLONOSCOPY     DILATION AND CURETTAGE OF UTERUS N/A 05/17/2023   Procedure: DILATATION AND CURETTAGE;  Surgeon: Carver Fila, MD;  Location: [facility];  Service: Gynecology;  Laterality: N/A;   GALLBLADDER SURGERY     ROBOTIC ASSISTED LAPAROSCOPIC SACROCOLPOPEXY N/A 05/17/2023   Procedure: XI ROBOTIC ASSISTED LAPAROSCOPIC SACROCOLPOPEXY;  Surgeon: Crist Fat, MD;  Location: [facility];  Service: Urology;  Laterality: N/A;   ROBOTIC ASSISTED TOTAL HYSTERECTOMY WITH BILATERAL SALPINGO OOPHERECTOMY Bilateral 05/17/2023   Procedure: XI ROBOTIC ASSISTED TOTAL HYSTERECTOMY WITH BILATERAL SALPINGO OOPHORECTOMY,POSSIBLE STAGING;  Surgeon: Carver Fila, MD;  Location: [facility];  Service: Gynecology;  Laterality: Bilateral;   ROTATOR CUFF REPAIR Left    TOTAL KNEE ARTHROPLASTY Right 08/15/2018   Procedure: TOTAL KNEE ARTHROPLASTY;  Surgeon: Jene Every, MD;  Location: [facility];  Service: Orthopedics;  Laterality: Right;  120 minutes    Family History  Problem Relation Age of Onset   Colon cancer Maternal Grandmother    Breast cancer Maternal Aunt     Ovarian cancer Neg Hx    Endometrial cancer Neg Hx    Pancreatic cancer Neg Hx    Prostate cancer Neg Hx     Social History   Socioeconomic History   Marital status: Married    Spouse name: Not on file   Number of children: Not on file   Years of education: Not on file   Highest education level: Not on file  Occupational History   Not on file  Tobacco Use   Smoking status: Never   Smokeless tobacco: Never  Vaping Use   Vaping status: Never Used  Substance and Sexual Activity   Alcohol use: Not Currently   Drug use: Never   Sexual activity: Not Currently  Other Topics Concern   Not on file  Social History Narrative   Not on file   Social Determinants of Health   Financial Resource Strain: Not on file  Food Insecurity: Not on file  Transportation Needs: Not on file  Physical Activity: Not on file  Stress: Not on file  Social Connections: Not on file    Current Medications:  Current Outpatient Medications:    Cholecalciferol (VITAMIN D-3) 125 MCG (5000 UT) TABS, Take 5,000 Units by mouth 3 (three) times a week., Disp: , Rfl:    estradiol (ESTRACE VAGINAL) 0.1 MG/GM vaginal cream, Place 1 Applicatorful vaginally 3 (three) times a week. Use 1 small dolyp of cream on tip of index finger and swap the inside of the vagina, Disp: 42.5 g, Rfl: 1   traMADol (ULTRAM) 50 MG tablet, Take 1 tablet (50 mg total) by mouth every 6 (six) hours as  needed for severe pain. For AFTER surgery only, do not take and drive (Patient not taking: Reported on 06/08/2023), Disp: 10 tablet, Rfl: 0  Review of Systems: + Hearing loss, joint pain Denies appetite changes, fevers, chills, fatigue, unexplained weight changes. Denies neck lumps or masses, mouth sores, ringing in ears or voice changes. Denies cough or wheezing.  Denies shortness of breath. Denies chest pain or palpitations. Denies leg swelling. Denies abdominal distention, pain, blood in stools, constipation, diarrhea, nausea,  vomiting, or early satiety. Denies pain with intercourse, dysuria, frequency, hematuria or incontinence. Denies hot flashes, pelvic pain, vaginal bleeding or vaginal discharge.   Denies back pain or muscle pain/cramps. Denies itching, rash, or wounds. Denies dizziness, headaches, numbness or seizures. Denies swollen lymph nodes or glands, denies easy bruising or bleeding. Denies anxiety, depression, confusion, or decreased concentration.  Physical Exam: BP (!) 178/74 (BP Location: Left Arm, Patient Position: Sitting) Comment: 190/78 was the first rechecked notified rn  Pulse 83   Temp 98.8 F (37.1 C)   Resp 20   Wt 197 lb 3.2 oz (89.4 kg)   SpO2 97%   BMI 33.85 kg/m  General: Alert, oriented, no acute distress. HEENT: Normocephalic, atraumatic, sclera anicteric. Chest: Unlabored breathing on room air. Abdomen: soft, nontender.  Normoactive bowel sounds.  No masses or hepatosplenomegaly appreciated.  Well-healed incisions. Extremities: Grossly normal range of motion.  Warm, well perfused.  No edema bilaterally. GU: Normal appearing external genitalia without erythema, excoriation, or lesions.  Speculum exam reveals cuff intact, no bleeding or discharge, suture visible.  Bimanual exam reveals cuff intact, no fluctuance or tenderness to palpation.    Laboratory & Radiologic Studies: A. ENDOMETRIAL BIOPSY:  - Endometrial hyperplasia with background cystic atrophy  - No atypia or malignancy identified   B. UTERUS, CERVIX, BILATERAL FALLOPIAN TUBES AND OVARIES, ENDOMETRIUM,  RESECTION:  - Right ovary: Benign fibroma  - Endometrium: Benign endometrial polyp with simple hyperplasia, no  atypia or malignancy identified  - Myometrium: Leiomyomata  - Cervix: Benign, nabothian cysts  - Bilateral fallopian tubes: Benign, paratubal cysts  - Left ovary: No significant pathologic changes   Assessment & Plan: [name] is a 87 y.o. woman s/p TRH/BSO and robotic sacrocolpopexy in the  setting of a complex adnexal mass, thickened endometrium, and pelvic organ prolapse.  Doing well.  Meeting postoperative milestones.  Discussed continued restrictions and expectations.  Reviewed pathology again from surgery.  She was given a copy of her pathology report.  She will be discharged back to the rest of her healthcare team.  16 minutes of total time was spent for this patient encounter, including preparation, face-to-face counseling with the patient and coordination of care, and documentation of the encounter.  Eugene Garnet, MD  Division of Gynecologic Oncology  Department of Obstetrics and Gynecology  [facility]

## 2023-06-16 ENCOUNTER — Ambulatory Visit: Payer: Medicare Other | Admitting: Gynecologic Oncology

## 2023-07-14 DIAGNOSIS — N3000 Acute cystitis without hematuria: Secondary | ICD-10-CM | POA: Diagnosis not present

## 2023-07-14 DIAGNOSIS — N8111 Cystocele, midline: Secondary | ICD-10-CM | POA: Diagnosis not present

## 2023-09-26 DIAGNOSIS — I10 Essential (primary) hypertension: Secondary | ICD-10-CM | POA: Diagnosis not present

## 2023-09-26 DIAGNOSIS — M8588 Other specified disorders of bone density and structure, other site: Secondary | ICD-10-CM | POA: Diagnosis not present

## 2023-09-28 ENCOUNTER — Other Ambulatory Visit: Payer: Self-pay | Admitting: Family Medicine

## 2023-09-28 DIAGNOSIS — G4700 Insomnia, unspecified: Secondary | ICD-10-CM | POA: Diagnosis not present

## 2023-09-28 DIAGNOSIS — Z0000 Encounter for general adult medical examination without abnormal findings: Secondary | ICD-10-CM | POA: Diagnosis not present

## 2023-09-28 DIAGNOSIS — Z806 Family history of leukemia: Secondary | ICD-10-CM | POA: Diagnosis not present

## 2023-09-28 DIAGNOSIS — K869 Disease of pancreas, unspecified: Secondary | ICD-10-CM | POA: Diagnosis not present

## 2023-09-28 DIAGNOSIS — M8580 Other specified disorders of bone density and structure, unspecified site: Secondary | ICD-10-CM

## 2023-09-28 DIAGNOSIS — M8588 Other specified disorders of bone density and structure, other site: Secondary | ICD-10-CM | POA: Diagnosis not present

## 2023-09-28 DIAGNOSIS — M150 Primary generalized (osteo)arthritis: Secondary | ICD-10-CM | POA: Diagnosis not present

## 2023-09-28 DIAGNOSIS — E559 Vitamin D deficiency, unspecified: Secondary | ICD-10-CM | POA: Diagnosis not present

## 2023-09-28 DIAGNOSIS — I10 Essential (primary) hypertension: Secondary | ICD-10-CM | POA: Diagnosis not present

## 2023-10-03 DIAGNOSIS — N8111 Cystocele, midline: Secondary | ICD-10-CM | POA: Diagnosis not present

## 2023-10-10 ENCOUNTER — Ambulatory Visit
Admission: RE | Admit: 2023-10-10 | Discharge: 2023-10-10 | Disposition: A | Discharge status: Home / Self Care | Payer: Medicare Other | Source: Ambulatory Visit | Attending: Family Medicine | Admitting: Family Medicine

## 2023-10-10 DIAGNOSIS — M8588 Other specified disorders of bone density and structure, other site: Secondary | ICD-10-CM | POA: Diagnosis not present

## 2023-10-10 DIAGNOSIS — M8580 Other specified disorders of bone density and structure, unspecified site: Secondary | ICD-10-CM

## 2023-12-30 DIAGNOSIS — I10 Essential (primary) hypertension: Secondary | ICD-10-CM | POA: Diagnosis not present

## 2023-12-30 DIAGNOSIS — M150 Primary generalized (osteo)arthritis: Secondary | ICD-10-CM | POA: Diagnosis not present

## 2024-01-29 DIAGNOSIS — I10 Essential (primary) hypertension: Secondary | ICD-10-CM | POA: Diagnosis not present

## 2024-01-29 DIAGNOSIS — M150 Primary generalized (osteo)arthritis: Secondary | ICD-10-CM | POA: Diagnosis not present

## 2024-02-29 DIAGNOSIS — I10 Essential (primary) hypertension: Secondary | ICD-10-CM | POA: Diagnosis not present

## 2024-02-29 DIAGNOSIS — M150 Primary generalized (osteo)arthritis: Secondary | ICD-10-CM | POA: Diagnosis not present

## 2024-03-27 DIAGNOSIS — E559 Vitamin D deficiency, unspecified: Secondary | ICD-10-CM | POA: Diagnosis not present

## 2024-03-27 DIAGNOSIS — I10 Essential (primary) hypertension: Secondary | ICD-10-CM | POA: Diagnosis not present

## 2024-03-27 DIAGNOSIS — M8588 Other specified disorders of bone density and structure, other site: Secondary | ICD-10-CM | POA: Diagnosis not present

## 2024-03-27 DIAGNOSIS — Z136 Encounter for screening for cardiovascular disorders: Secondary | ICD-10-CM | POA: Diagnosis not present

## 2024-03-31 DIAGNOSIS — M150 Primary generalized (osteo)arthritis: Secondary | ICD-10-CM | POA: Diagnosis not present

## 2024-03-31 DIAGNOSIS — I10 Essential (primary) hypertension: Secondary | ICD-10-CM | POA: Diagnosis not present

## 2024-04-30 DIAGNOSIS — M150 Primary generalized (osteo)arthritis: Secondary | ICD-10-CM | POA: Diagnosis not present

## 2024-04-30 DIAGNOSIS — I10 Essential (primary) hypertension: Secondary | ICD-10-CM | POA: Diagnosis not present

## 2024-06-19 DIAGNOSIS — Z9181 History of falling: Secondary | ICD-10-CM | POA: Diagnosis not present

## 2024-06-19 DIAGNOSIS — Z6834 Body mass index (BMI) 34.0-34.9, adult: Secondary | ICD-10-CM | POA: Diagnosis not present

## 2024-06-19 DIAGNOSIS — E669 Obesity, unspecified: Secondary | ICD-10-CM | POA: Diagnosis not present
